# Patient Record
Sex: Male | Born: 1995 | Race: Black or African American | Hispanic: No | Marital: Single
Health system: Midwestern US, Academic
[De-identification: ages and names within clinical notes are randomized; demographics above are authoritative.]

---

## 2016-10-13 ENCOUNTER — Encounter: Admit: 2016-10-13 | Discharge: 2016-10-14 | Primary: Adolescent Medicine

## 2016-10-13 DIAGNOSIS — R69 Illness, unspecified: Principal | ICD-10-CM

## 2016-10-14 ENCOUNTER — Encounter: Admit: 2016-10-14 | Discharge: 2016-10-15 | Primary: Adolescent Medicine

## 2016-10-14 DIAGNOSIS — R69 Illness, unspecified: Principal | ICD-10-CM

## 2016-10-19 ENCOUNTER — Encounter: Admit: 2016-10-19 | Discharge: 2016-10-19 | Payer: MEDICAID | Primary: Adolescent Medicine

## 2016-10-19 DIAGNOSIS — R69 Illness, unspecified: Principal | ICD-10-CM

## 2016-10-21 ENCOUNTER — Encounter: Admit: 2016-10-21 | Discharge: 2016-10-21 | Payer: MEDICAID | Primary: Adolescent Medicine

## 2016-10-21 ENCOUNTER — Ambulatory Visit: Admit: 2016-10-21 | Discharge: 2016-10-22 | Payer: MEDICAID | Primary: Adolescent Medicine

## 2016-10-21 ENCOUNTER — Ambulatory Visit: Admit: 2016-10-21 | Discharge: 2016-10-21 | Primary: Adolescent Medicine

## 2016-10-21 DIAGNOSIS — J324 Chronic pansinusitis: ICD-10-CM

## 2016-10-21 DIAGNOSIS — J341 Cyst and mucocele of nose and nasal sinus: Principal | ICD-10-CM

## 2016-10-21 DIAGNOSIS — J45909 Unspecified asthma, uncomplicated: Principal | ICD-10-CM

## 2016-10-21 DIAGNOSIS — M952 Other acquired deformity of head: ICD-10-CM

## 2016-10-21 DIAGNOSIS — J339 Nasal polyp, unspecified: Principal | ICD-10-CM

## 2016-10-21 DIAGNOSIS — H05812 Cyst of left orbit: ICD-10-CM

## 2016-10-21 DIAGNOSIS — G960 Cerebrospinal fluid leak: Principal | ICD-10-CM

## 2016-10-21 NOTE — Progress Notes
Date of Service: 10/21/2016    Subjective:             Jonathon Simon is a 21 y.o. male.    History of Present Illness  Jonathon Simon presents to clinic for evaluation of sinus mass.     He was seen at [facility] and discharged on 10/18/16 and told to follow up with me at [facility].   He was evaluated at [facility] for a potential mucocele of the orbital region and erosion of the bilateral frontal sinus walls.  He was administered IV antibiotics for 72 hours, finishing on 7/1 and was put on PO Augmentin 875 BID for 21 days, as well as prescribed a prednisone taper for 28 days.     Had seen an ENT for a year due to lack of smell for 4 years and nasal polyps.   Denies history of sinus surgery.  Symptoms have persisted for 2 years and were variable in intensity.   He has had visual disturbances, dizziness, headaches which prompted his visit to the hospital.   His sinus symptoms are also significant for congestion, facial swelling, sinus pain and pressure, sore throat, epistaxis, postnasal drip, and rhinorrhea. The rhinorrhea is bilateral with the right side being very runny, water like.  When he breathes right now he feels pressure in his sinuses.   His sense of smell has returned.  He intermittently has some bilateral rhinorrhea.        Review of Systems   Constitutional: Positive for diaphoresis.   HENT: Positive for congestion, facial swelling, nosebleeds, postnasal drip, rhinorrhea, sinus pain, sinus pressure, sneezing and sore throat.    Eyes: Positive for photophobia, pain, discharge, itching and visual disturbance.   Respiratory: Positive for cough.    Cardiovascular: Negative.    Gastrointestinal: Positive for nausea and vomiting.   Endocrine: Negative.    Genitourinary: Negative.    Musculoskeletal: Positive for neck pain and neck stiffness.   Skin: Negative.    Allergic/Immunologic: Negative.    Neurological: Positive for dizziness, numbness and headaches.   Hematological: Negative. Psychiatric/Behavioral: The patient is nervous/anxious.          Objective:         ??? alprazolam (XANAX PO) Take  by mouth.   ??? AMOXICILLIN PO Take  by mouth.   ??? fluticasone (FLONASE) 50 mcg/actuation nasal spray Apply  to each nostril as directed daily. Shake bottle gently before using.   ??? morphine IR (MS-IR) 15 mg tablet Take 15 mg by mouth every 4 hours as needed for Pain   ??? PREDNISONE PO Take  by mouth.   ??? sinus rinse pack kit Apply 1 packet into nose as directed as Needed.     Vitals:    10/21/16 1352   BP: 127/76   Pulse: 71   Weight: 99.8 kg (220 lb)   Height: 182.9 cm (72)     Body mass index is 29.84 kg/m???.     Physical Exam  General:  Well-developed, well-nourished  Communication and Voice:  Clear pitch and clarity  Hearing: Hearing adequate for verbal communication bilaterally   Inspection:  Normocephalic and atraumatic without mass or lesion  Palpation:  Facial skeleton intact without bony stepoffs  Facial Strength:  Facial motility symmetric and full bilaterally  Pinna:  External ear intact and fully developed  External canal:  Canal is patent with intact skin  Tympanic Membrane:  Normal bilaterally  External nose:  No scar or anatomic deformity  Internal Nose:  Septum intact with mild S-shaped septal deviation with spur.  + edema, + polyp, or rhinorrhea.  Oral cavity, Lips, Teeth, and Gums:  Mucosa and teeth intact and viable, No lesions, masses or ulcers  Oropharynx: No erythema or exudate, no masses or ulcerations, non-obstructive tonsils  Larynx:  Normal voice, no stridor or stertor.    Neck, Trachea, Lymphatics:  Midline trachea without mass or lesion, no lymphadenopathy  Thyroid:  No mass or nodularity  Eyes: No nystagmus with equal extraocular motion bilaterally  Neuro/Psych/Balance: Patient oriented and appropriate in interaction;  Appropriate mood and affect;  Gait is intact with no imbalance; Cranial nerves I-XII are intact Respiratory effort:  Equal inspiration and expiration, no respiratory distress  Peripheral Vascular:  Warm extremities with equal distal pulses    Nasal Endoscopy: Patient is anesthetized with 4% lidocaine and decongested with neosynephrine.  Rigid endoscopy is performed; the patient tolerated the procedure well.  There is a mild septal deviation with a spur.  There is bilateral inferior turbinate hypertrophy.  There is global boggy mucosal edema.  The sinonasal anatomy is normal.  The bilateral osteomeatal complexes are partially obstructed with polyps as is the sphenoethmoid recess and the face of sphenoid is normal bilaterally.  There are no masses, no purulence.  + polyps confined to the upper nasal cavity.  The nasopharynx is normal.  Polyps in the bilateral middle meatus.     CT and MRI images from [facility] reviewed.  There is a massive amount of polyp burden and there is dehiscence of the posterior table bilaterally as well as dehiscence into the orbit on the left. No suspicious areas for tumor.         Assessment and Plan:  Encounter Diagnoses   Name Primary?   ??? CSF leak Yes   ??? Mucocele of frontal sinus    ??? Mucocele of left orbital region    ??? Chronic pansinusitis    ??? Nasal polyposis    ??? Acquired skull defect          Right now his symptoms and CT findings indicate a need for surgery to clear out the mucocele and polyps and decompress the frontal sinuses in order to prevent impending intracranial or orbital complications. This might allow the eroded portions of the frontal sinus wall to re-ossify.   The current resolution of his symptoms is likely due to the prednisone and inpatient steroid administration.  He has responded well to steroids but clearly still needs sinus surgery.    I do not think that the rhinorrhea is CSF, but I gave patient a sterile container to try to catch rhinorrhea to evaluate for CSF with beta 2 transferrin.  It is more likely that the rhinorrhea is due to the secretion of the inflamed mucosa.    Obtain navigation CT prior to surgery to use intraoperatively.  Another indication for the CT is that the polyp disease did change significantly with steroids, I think it important to know what is happening within the sinuses prior to surgery given the large amount of skull base erosion.                       In the presence of Lamar Benes, MD,  I have taken down these notes, Cormac Prosser, Scribe. 10/21/2016 2:42 PM

## 2016-10-22 ENCOUNTER — Encounter: Admit: 2016-10-22 | Discharge: 2016-10-22 | Payer: MEDICAID | Primary: Adolescent Medicine

## 2016-10-22 DIAGNOSIS — R69 Illness, unspecified: Principal | ICD-10-CM

## 2016-10-28 ENCOUNTER — Encounter: Admit: 2016-10-28 | Discharge: 2016-10-28 | Payer: MEDICAID | Primary: Adolescent Medicine

## 2016-10-28 DIAGNOSIS — J45909 Unspecified asthma, uncomplicated: Principal | ICD-10-CM

## 2016-10-29 ENCOUNTER — Ambulatory Visit: Admit: 2016-10-29 | Discharge: 2016-10-29 | Primary: Adolescent Medicine

## 2016-10-29 ENCOUNTER — Encounter: Admit: 2016-10-29 | Discharge: 2016-10-29 | Payer: MEDICAID | Primary: Adolescent Medicine

## 2016-10-29 ENCOUNTER — Encounter: Admit: 2016-10-28 | Discharge: 2016-10-28 | Payer: MEDICAID | Primary: Adolescent Medicine

## 2016-10-29 DIAGNOSIS — J339 Nasal polyp, unspecified: ICD-10-CM

## 2016-10-29 DIAGNOSIS — R040 Epistaxis: ICD-10-CM

## 2016-10-29 DIAGNOSIS — J45909 Unspecified asthma, uncomplicated: ICD-10-CM

## 2016-10-29 DIAGNOSIS — F419 Anxiety disorder, unspecified: ICD-10-CM

## 2016-10-29 DIAGNOSIS — K219 Gastro-esophageal reflux disease without esophagitis: ICD-10-CM

## 2016-10-29 DIAGNOSIS — J329 Chronic sinusitis, unspecified: ICD-10-CM

## 2016-10-29 DIAGNOSIS — J310 Chronic rhinitis: ICD-10-CM

## 2016-10-29 DIAGNOSIS — Z789 Other specified health status: ICD-10-CM

## 2016-10-29 MED ORDER — OXYCODONE 5 MG PO TAB
ORAL_TABLET | ORAL | 0 refills | 6.00000 days | Status: AC | PRN
Start: 2016-10-29 — End: 2016-11-02
  Filled 2016-10-29 (×2): qty 15, 3d supply, fill #1

## 2016-10-29 MED ORDER — SUCCINYLCHOLINE CHLORIDE 20 MG/ML IJ SOLN
INTRAVENOUS | 0 refills | Status: DC
Start: 2016-10-29 — End: 2016-10-29
  Administered 2016-10-29: 15:00:00 40 mg via INTRAVENOUS

## 2016-10-29 MED ORDER — ALPRAZOLAM 0.5 MG PO TAB
0.25 mg | Freq: Once | ORAL | 0 refills | Status: CP
Start: 2016-10-29 — End: ?
  Administered 2016-10-29: 13:00:00 0.25 mg via ORAL

## 2016-10-29 MED ORDER — WHITE PETROLATUM-MINERAL OIL 83-15 % OP OINT
0 refills | Status: DC
Start: 2016-10-29 — End: 2016-11-01
  Administered 2016-10-29: 15:00:00 1 via OPHTHALMIC

## 2016-10-29 MED ORDER — FENTANYL CITRATE (PF) 50 MCG/ML IJ SOLN
0 refills | Status: DC
Start: 2016-10-29 — End: 2016-10-29
  Administered 2016-10-29: 15:00:00 50 ug via INTRAVENOUS

## 2016-10-29 MED ORDER — MORPHINE 15 MG PO TBER
15 mg | Freq: Once | ORAL | 0 refills | Status: CP
Start: 2016-10-29 — End: ?
  Administered 2016-10-29: 13:00:00 15 mg via ORAL

## 2016-10-29 MED ORDER — FENTANYL CITRATE (PF) 50 MCG/ML IJ SOLN
50 ug | INTRAVENOUS | 0 refills | Status: DC | PRN
Start: 2016-10-29 — End: 2016-11-01
  Administered 2016-10-29 (×4): 50 ug via INTRAVENOUS

## 2016-10-29 MED ORDER — MEPERIDINE (PF) 25 MG/ML IJ SYRG
12.5 mg | INTRAVENOUS | 0 refills | Status: DC | PRN
Start: 2016-10-29 — End: 2016-11-01

## 2016-10-29 MED ORDER — PREDNISONE 10 MG PO TAB
10 mg | ORAL_TABLET | Freq: Every day | ORAL | 0 refills | Status: AC
Start: 2016-10-29 — End: 2016-12-16
  Filled 2016-10-29 (×2): qty 40, 16d supply, fill #1

## 2016-10-29 MED ORDER — LACTATED RINGERS IV SOLP
1000 mL | INTRAVENOUS | 0 refills | Status: AC
Start: 2016-10-29 — End: ?
  Administered 2016-10-29: 20:00:00 1000 mL via INTRAVENOUS
  Administered 2016-10-29 (×2): 1000.000 mL via INTRAVENOUS

## 2016-10-29 MED ORDER — REMIFENTANYL 1000MCG IN NS 20ML (OR)
0 refills | Status: DC
Start: 2016-10-29 — End: 2016-10-29
  Administered 2016-10-29 (×2): .05 ug/kg/min via INTRAVENOUS

## 2016-10-29 MED ORDER — MIDAZOLAM 1 MG/ML IJ SOLN
INTRAVENOUS | 0 refills | Status: DC
Start: 2016-10-29 — End: 2016-10-29
  Administered 2016-10-29: 15:00:00 2 mg via INTRAVENOUS

## 2016-10-29 MED ORDER — ACETAMINOPHEN 1,000 MG/100 ML (10 MG/ML) IV SOLN
1000 mg | Freq: Once | INTRAVENOUS | 0 refills | Status: CP
Start: 2016-10-29 — End: ?
  Administered 2016-10-29: 20:00:00 1000 mg via INTRAVENOUS

## 2016-10-29 MED ORDER — HYDROCORTISONE SOD SUCC (PF) 100 MG/2 ML IJ SOLR
0 refills | Status: DC
Start: 2016-10-29 — End: 2016-10-29
  Administered 2016-10-29: 15:00:00 100 mg via INTRAVENOUS

## 2016-10-29 MED ORDER — LIDOCAINE (PF) 200 MG/10 ML (2 %) IJ SYRG
0 refills | Status: DC
Start: 2016-10-29 — End: 2016-10-29
  Administered 2016-10-29: 15:00:00 100 mg via INTRAVENOUS

## 2016-10-29 MED ORDER — LIDOCAINE (PF) 10 MG/ML (1 %) IJ SOLN
.1-2 mL | INTRAMUSCULAR | 0 refills | Status: DC | PRN
Start: 2016-10-29 — End: 2016-11-01
  Administered 2016-10-29: 12:00:00 0.1 mL via INTRAMUSCULAR

## 2016-10-29 MED ORDER — ACETAMINOPHEN 500 MG PO TAB
500 mg | Freq: Once | ORAL | 0 refills | Status: DC
Start: 2016-10-29 — End: 2016-10-29

## 2016-10-29 MED ORDER — PROPOFOL INJ 10 MG/ML IV VIAL
0 refills | Status: DC
Start: 2016-10-29 — End: 2016-10-29
  Administered 2016-10-29: 15:00:00 200 mg via INTRAVENOUS

## 2016-10-29 MED ORDER — ACETAMINOPHEN 500 MG PO TAB
500 mg | ORAL_TABLET | ORAL | 0 refills | Status: AC | PRN
Start: 2016-10-29 — End: 2016-11-04

## 2016-10-29 MED ORDER — PHENYLEPHRINE IN 0.9% NACL(PF) 1 MG/10 ML (100 MCG/ML) IV SYRG
INTRAVENOUS | 0 refills | Status: DC
Start: 2016-10-29 — End: 2016-10-29
  Administered 2016-10-29: 17:00:00 100 ug via INTRAVENOUS

## 2016-10-29 MED ORDER — OXYMETAZOLINE 0.05 % NA SPRY
0 refills | Status: DC
Start: 2016-10-29 — End: 2016-11-01
  Administered 2016-10-29: 15:00:00 3 via NASAL

## 2016-10-29 MED ORDER — LIDOCAINE 1%-EPINEPHRINE 1:100000 (BUFFERED) VIAL
0 refills | Status: DC
Start: 2016-10-29 — End: 2016-11-01
  Administered 2016-10-29 (×2): 8 mL via INTRAMUSCULAR

## 2016-10-29 MED ORDER — CEFAZOLIN 1 GRAM IJ SOLR
0 refills | Status: DC
Start: 2016-10-29 — End: 2016-10-29
  Administered 2016-10-29: 15:00:00 2 g via INTRAVENOUS

## 2016-10-29 MED ORDER — SUGAMMADEX 100 MG/ML IV SOLN
INTRAVENOUS | 0 refills | Status: DC
Start: 2016-10-29 — End: 2016-10-29
  Administered 2016-10-29: 19:00:00 204 mg via INTRAVENOUS

## 2016-10-29 MED ORDER — DEXAMETHASONE SODIUM PHOSPHATE 4 MG/ML IJ SOLN
4 mg | Freq: Once | INTRAVENOUS | 0 refills | Status: AC | PRN
Start: 2016-10-29 — End: ?

## 2016-10-29 MED ADMIN — LACTATED RINGERS IV SOLP [4318]: 1000 mL | INTRAVENOUS | @ 12:00:00 | Stop: 2016-10-29 | NDC 00338011704

## 2016-10-29 NOTE — H&P (View-Only)
Admission History and Physical Examination      Name:  Jonathon Simon                                             MRN:  [id number]   Admission Date:  10/29/2016                     Assessment/Plan:    Principal Problem:    Nasal polyps      I have seen and examined Jonathon Simon. There are no significant changes from the previous H&P performed on 10/21/16. Plan is for OR today for bilateral endoscopic sinus surgery (maxillary, ethmoid, sphenoid, frontal), possble csf leak repair or septoplasty as indicated with Dr. Sol Blazing. The risks, benefits, and alternatives to the procedure were discussed with the patient, who demonstrated understanding of the above and wishes to proceed. All questions were answered. Written informed consent was obtained and placed in the chart.    Mia Creek, MD  Otolaryngology Resident   Pager [phone]      __________________________________________________________________________________  Primary Care Physician: No Pcp, Na     Chief Complaint: chronic rhinosinusitis w nasal polypsis.     History of Present Illness: Jonathon Simon is a 21 y.o. male who presents to [facility] for bilateral endoscopic sinus surgery (maxillary, ethmoid, sphenoid, frontal) with Dr. Sol Blazing. Last seen in ENT clinic 10/21/16. No interval hospitalizations or changes to medication. Has been NPO since midnight.     HPI from most recent clinic visit is included below:  Jonathon Simon presents to clinic for evaluation of sinus mass.   ???  He was seen at [facility] and discharged on 10/18/16 and told to follow up with me at [facility].   He was evaluated at [facility] for a potential mucocele of the orbital region and erosion of the bilateral frontal sinus walls.  He was administered IV antibiotics for 72 hours, finishing on 7/1 and was put on PO Augmentin 875 BID for 21 days, as well as prescribed a prednisone taper for 28 days.   ??? Had seen an ENT for a year due to lack of smell for 4 years and nasal polyps.   Denies history of sinus surgery.  Symptoms have persisted for 2 years and were variable in intensity.   He has had visual disturbances, dizziness, headaches which prompted his visit to the hospital.   His sinus symptoms are also significant for congestion, facial swelling, sinus pain and pressure, sore throat, epistaxis, postnasal drip, and rhinorrhea. The rhinorrhea is bilateral with the right side being very runny, water like.  When he breathes right now he feels pressure in his sinuses.   His sense of smell has returned.  He intermittently has some bilateral rhinorrhea.     Past Medical History:   Diagnosis Date   ??? Anxiety    ??? Asthma    ??? No blood products     No is Jehovah's Witness, does not want any blood products.    ??? Rhinitis    ??? Sinusitis      Past Surgical History:   Procedure Laterality Date   ??? BIOPSY      biopsy on pt's penis     Family history reviewed; non-contributory  Social History     Social History   ??? Marital status: Single  Spouse name: N/A   ??? Number of children: N/A   ??? Years of education: N/A     Social History Main Topics   ??? Smoking status: Never Smoker   ??? Smokeless tobacco: Never Used   ??? Alcohol use No      Comment: pt states I used to on occasion   ??? Drug use: No   ??? Sexual activity: Not on file     Other Topics Concern   ??? Not on file     Social History Narrative   ??? No narrative on file      Immunizations (includes history and patient reported):   There is no immunization history on file for this patient.        Allergies:  Iodine and Peanut    Medications:  Prescriptions Prior to Admission   Medication Sig   ??? alprazolam (XANAX PO) Take  by mouth.   ??? AMOXICILLIN PO Take  by mouth.   ??? fluticasone (FLONASE) 50 mcg/actuation nasal spray Apply  to each nostril as directed daily. Shake bottle gently before using.   ??? morphine IR (MS-IR) 15 mg tablet Take 15 mg by mouth every 4 hours as needed for Pain   ??? polyethylene glycol 3350 (MIRALAX) 17 g packet Take 17 g by mouth daily.   ??? PREDNISONE PO Take  by mouth.   ??? sinus rinse pack kit Apply 1 packet into nose as directed as Needed.     Review of Systems:  All other systems reviewed and are negative.    Physical Exam:  Vital Signs: Last Filed In 24 Hours Vital Signs: 24 Hour Range         Intensity Pain Scale 0-10 (Pain 1): 7 (when pt moves his eye) (10/29/16 0706)      General:  Well-developed, well-nourished  Communication and Voice:  Clear pitch and clarity  Hearing: Hearing adequate for verbal communication bilaterally   Inspection:  Normocephalic and atraumatic without mass or lesion  Palpation:  Facial skeleton intact without bony stepoffs  Facial Strength:  Facial motility symmetric and full bilaterally  Pinna:  External ear intact and fully developed  External canal:  Canal is patent with intact skin  Tympanic Membrane:  Normal bilaterally  External nose:  No scar or anatomic deformity  Internal Nose:  Septum intact with mild S-shaped septal deviation with spur.  No edema, polyp, or rhinorrhea.  Oral cavity, Lips, Teeth, and Gums:  Mucosa and teeth intact and viable, No lesions, masses or ulcers  Oropharynx: No erythema or exudate, no masses or ulcerations, non-obstructive tonsils  Larynx:  Normal voice, no stridor or stertor.    Neck, Trachea, Lymphatics:  Midline trachea without mass or lesion, no lymphadenopathy  Thyroid:  No mass or nodularity  Eyes: No nystagmus with equal extraocular motion bilaterally  Neuro/Psych/Balance: Patient oriented and appropriate in interaction;  Appropriate mood and affect;  Gait is intact with no imbalance; Cranial nerves I-XII are intact  Respiratory effort:  Equal inspiration and expiration, no respiratory distress  Peripheral Vascular:  Warm extremities with equal distal pulses    Lab/Radiology/Other Diagnostic Tests:  24-hour labs:  No results found for this visit on 10/29/16 (from the past 24 hour(s)).     Pertinent radiology reviewed.    Mia Creek, MD  Pager

## 2016-10-29 NOTE — Anesthesia Post-Procedure Evaluation
Post-Anesthesia Evaluation    Name: Jonathon Simon      MRN: [id number]     DOB: 02-Jun-1995     Age: 21 y.o.     Sex: male   __________________________________________________________________________     Procedure Date: 10/29/2016  Procedure: Procedure(s) with comments:  ENDOSCOPY NASAL/ SINUS WITH ETHMOIDECTOMY AND SPHENOIDOTOMY/ TISSUE REMOVAL - CASE LENGTH 3 HOURS, STRYKER NAVIGATION AND CT  ENDOSCOPY NASAL/ SINUS WITH EXCISION TISSUE MAXILLARY SINUS  ENDOSCOPY NASAL/ SINUS WITH FRONTAL SINUS EXPLORATION WITH/ WITHOUT TISSUE REMOVAL  FUNCTIONAL ENDOSCOPY SINUS SURGERY IMAGE-GUIDED      Surgeon: Surgeon(s):  Lovie CholLepse, Jason, MD  Lovie CholLepse, Jason, MD  Lamar BenesBeahm, Donald David, MD  Lamar BenesBeahm, Donald David, MD  Mia CreekKavookjian, Hannah, MD  Mia CreekKavookjian, Hannah, MD    Post-Anesthesia Vitals  BP: 116/66 (07/13 1600)  Temp: 36.5 C (97.7 F) (07/13 1600)  Pulse: 89 (07/13 1600)  Respirations: 17 PER MINUTE (07/13 1600)  SpO2: 99 % (07/13 1600)  O2 Delivery: None (Room Air) (07/13 1600)  SpO2 Pulse: 86 (07/13 1600)      Post Anesthesia Evaluation Note    Evaluation location: Pre/Post  Patient participation: recovered; patient participated in evaluation  Level of consciousness: alert    Pain score: 5  Pain management: adequate    Hydration: normovolemia  Temperature: 36.0C - 38.4C  Airway patency: adequate    Perioperative Events  Perioperative events:  no       Post-op nausea and vomiting: no PONV    Postoperative Status  Cardiovascular status: hemodynamically stable  Respiratory status: spontaneous ventilation  Follow-up needed: none      Perioperative Events  Perioperative Event: No  Emergency Case Activation: No     Post-Anesthesia Evaluation Attestation: I reviewed and agree the indicated post-anethesia care was provided.    Staff name:  Brayton LaymanSheldon Jarin Cornfield, MD Date:  10/29/2016

## 2016-11-01 NOTE — Operative Report(Direct Entry)
OPERATIVE REPORT    Name: Jonathon Simon is a 21 y.o. male     DOB: 09-Mar-1996             MRN#: [id number]    DATE OF OPERATION: 10/29/2016    Surgeon(s) and Role:     * Lovie Chol, MD - Resident - Observing     * Lovie Chol, MD - Resident - Observing     * Tasman Zapata, Evans Lance, MD - Primary     * Kariann Wecker, Evans Lance, MD - Primary     * Mia Creek, MD - Resident - Assisting     * Mia Creek, MD - Resident - Assisting        Preoperative Diagnosis:    Nasal polyps [J33.9]    Post-op Diagnosis      * Nasal polyps [J33.9]    Procedure(s) (LRB):  NASAL ENDOSCOPY WITH TOTAL ETHMOIDECTOMY AND SPHENOIDOTOMY WITH TISSUE REMOVAL (Bilateral)  NASAL ENDOSCOPY WITH MAXILLARY ANTROSTOMY WITH TISSUE MAXILLARY REMOVAL (Bilateral)  NASAL ENDOSCOPY WITH FRONTAL SINUSOTOMY (Bilateral)  FUNCTIONAL ENDOSCOPY SINUS SURGERY IMAGE-GUIDED (Bilateral)  SUBMUCOUS RESECTION TURBINATE (Bilateral)    Anesthesia Type: General    Description and Findings of Operative Procedure:     The patient was identified in the pre-operative holding area. The consent for surgery was reaffirmed and all questions were answered. The patient agreed to proceed with surgery.    The sinus surgery itself was discussed at length. The risks, benefits, and alternatives were explained in detail which include but is not limited to: anesthesia, pain, bleeding, infection, need for nasal packing, loss of smell/taste, double vision, vision loss, CSF leak requiring other procedures to repair, numbness of teeth/and or face, need for revision surgery, brain/skull base injury and unexpected risks. The patient understands these risks and is willing to proceed with surgery.  All questions were answered.    The patient was taken to the operating theater and was placed in the supine position on the operating table. A pre-induction timeout was performed confirming the patient's name, medical record number, date of birth, the planned operative procedure, and patient's medication allergies. The patient then underwent induction of general anesthesia and intubation by the Anesthesia staff. The table was then rotated 90 degrees.    The stereotactic CT image guidance system was then brought to the field. The CT data were loaded, reviewed, and an operative plan was finalized. The patient underwent surface match registration that was confirmed accurate in 3 separate planes. The patient was prepped and draped in the standard fashion for endoscopic sinus surgery. The bilateral nasal cavities were prepared with Afrin pledgets.     Next, the 0-degree endoscope was used to visualize the each nasal cavity. 1% Lidocaine with 1:100,000 Epinephrine was then injected into the anticipated operative sites.     The procedure began with left maxillary endoscopy with mucous membrane removal. The microdebrider was used to remove the polyps and polypoid tissue from the middle meatus. An inferior slot in the uncinate process was made with the tip of the pediatric backbiter and then the vertical portion removed with sharp dissection and microdebrider.  A 30 degree endoscope was then used to identify the maxillary osteum and the maxillary sinus natural ostium was then entered with a straight through cutting forceps. There was noted to be polypoid changes within the maxillary sinus.  The ostium was widened to the posterior fontanelle.  Polyps within the maxillary sinus were removed with an angled microdebrider. An angled endoscope was  used to confirm the connection to the natural maxillary sinus os, which was widened circumferentially and mucous membranes were removed. The sinus then was copiously irrigated.    Then left nasal endoscopy with total ethmoidectomy was performed. The ethmoid bulla was removed with a microdebrider and kerrison. Anterior ethmoid cells were removed with straight and up biting through cutting forceps and microdebrider. The basal lamella was then entered.  All anterior and posterior ethmoid cells were completely removed with straight and up biting through cutting forceps and the Kerrison rongeurs. The skull base and lamina papyracea was identified with direct visualization and with image guidance and protected at all times. The anterior and posterior ethmoid cells were meticulously taken off the skull base as well as the lamina papyracea with use of angled instrumentation in a posterior to anterior fashion. Polyps within the ethmoid sinuses were resected with the microdebrider.  The sinus was copiously irrigated.    Next, left sphenoid endoscopy with mucous membrane was performed. The middle turbinate was medialized and the sphenoid os was identified and then entered. The anterior face of the sphenoid was then enlarged with Kerrison rongeurs. Polypoid changes and mucosa were noted and removed from the sphenoid os with the debrider.  The skull base was identified within the sphenoid sinus. The sinus then was copiously irrigated.    Next, the 0-degree endoscope was used to visualize the right nasal cavity.     Then, right maxillary endoscopy with mucous membrane removal was performed.  The microdebrider was used to remove the polyps and polypoid tissue from the middle meatus. An inferior slot in the uncinate process was made with the tip of the pediatric backbiter and then the vertical portion removed with sharp dissection and microdebrider.  A 30 degree endoscope was then used to identify the maxillary osteum and the maxillary sinus natural ostium was then entered with a straight through cutting forceps. There was noted to be polypoid changes within the maxillary sinus.  The ostium was widened to the posterior fontanelle.  Polyps within the maxillary sinus were removed with an angled microdebrider. An angled endoscope was used to confirm the connection to the natural maxillary sinus os, which was widened circumferentially and mucous membranes were removed. The sinus then was copiously irrigated.    Then right nasal endoscopy with total ethmoidectomy was performed. The ethmoid bulla was removed with a microdebrider and kerrison. Anterior ethmoid cells were removed with straight and up biting through cutting forceps and microdebrider. The basal lamella was then entered.  All anterior and posterior ethmoid cells were completely removed with straight and up biting through cutting forceps and the Kerrison rongeurs. The skull base and lamina papyracea was identified with direct visualization and with image guidance and protected at all times. The anterior and posterior ethmoid cells were meticulously taken off the skull base as well as the lamina papyracea with use of angled instrumentation in a posterior to anterior fashion.  Polyps within the ethmoid sinuses were resected with the microdebrider.  The sinus was copiously irrigated.    Next, right sphenoid endoscopy with mucous membrane was performed. The middle turbinate was medialized and the sphenoid os was identified and then entered. The anterior face of the sphenoid was then enlarged with Kerrison rongeurs. Polypoid changes and mucosa were noted and removed from the sphenoid os with the debrider.  The skull base was identified within the sphenoid sinus. The sinus then was copiously irrigated.    Next, left nasal endoscopy with frontal sinusotomy was  performed. The skull base was then followed anteriorly.  A combination of 30, 45, and 70 endoscopes were used to complete the surgery.  The frontal sinus was then entered. Angled instrumentation and an angled microdebrider was then used to open the natural frontal sinus outflow tract circumferentially.  Polyps within the frontal sinus were removed with the angled microdebrider.    Next, right nasal endoscopy with frontal sinusotomy was performed. The skull base was then followed anteriorly.  A combination of 30, 45, and 70 endoscopes were used to complete the surgery.  The frontal sinus was then entered. Angled instrumentation and an angled microdebrider was then used to open the natural frontal sinus outflow tract circumferentially.  Polyps within the frontal sinus were removed with the angled microdebrider.    Attention was then turned to the inferior nasal turbinates.  The anterior head of the turbinates were injected with 1% lidocaine with 1:100,000 epinephrine.  The anterior head of the inferior turbinate on the left was incised with a 15 blade knife and the submucosal tissues were freed from the surrounding bone with a Cottle elevator.  The debrider was then used to remove excess submucosal tissue.  The turbinate was then out-fractured.  The same procedure was then repeated on the right inferior turbinate.      Next, the bilateral nasal cavities were irrigated and hemostasis was achieved. Posisep was placed bilaterally in the middle meatus to facilitate healing and propel contour stents were placed in the frontal tracts. The patient was then suctioned with an orogastric tube. The patient was then rotated back toward anesthesia for reversal of general anesthetic and extubation. The operating table was then turned 90 degrees back to the care of the anesthesia team who reversed a general anesthetic and extubated patient without difficulty. The patient was then transferred to recovery in stable condition.    The patient tolerated the procedure well and there were no immediate postoperative complications.       Estimated Blood Loss:  200 ml    Specimen(s) Removed/Disposition:   ID Type Source Tests Collected by Time Destination   1 : BILATERAL SINUS CONTENTS - ROUTINE Tissue Sinus SURGICAL PATHOLOGY          Lamar Benes, MD 10/29/2016 1305            Lamar Benes, MD

## 2016-11-02 ENCOUNTER — Encounter: Admit: 2016-11-02 | Discharge: 2016-11-02 | Payer: MEDICAID | Primary: Adolescent Medicine

## 2016-11-02 DIAGNOSIS — J329 Chronic sinusitis, unspecified: ICD-10-CM

## 2016-11-02 DIAGNOSIS — K219 Gastro-esophageal reflux disease without esophagitis: ICD-10-CM

## 2016-11-02 DIAGNOSIS — J339 Nasal polyp, unspecified: ICD-10-CM

## 2016-11-02 DIAGNOSIS — J45909 Unspecified asthma, uncomplicated: Principal | ICD-10-CM

## 2016-11-02 DIAGNOSIS — J310 Chronic rhinitis: ICD-10-CM

## 2016-11-02 DIAGNOSIS — F419 Anxiety disorder, unspecified: ICD-10-CM

## 2016-11-02 DIAGNOSIS — Z789 Other specified health status: ICD-10-CM

## 2016-11-02 MED ORDER — OXYCODONE 5 MG PO TAB
ORAL_TABLET | ORAL | 0 refills | 6.00000 days | Status: AC | PRN
Start: 2016-11-02 — End: 2016-11-02

## 2016-11-02 MED ORDER — OXYCODONE 5 MG PO TAB
ORAL_TABLET | ORAL | 0 refills | 6.00000 days | Status: AC | PRN
Start: 2016-11-02 — End: 2016-11-04
  Filled 2016-11-02 (×2): qty 15, 2d supply, fill #1

## 2016-11-04 ENCOUNTER — Encounter: Admit: 2016-11-04 | Discharge: 2016-11-04 | Payer: MEDICAID | Primary: Adolescent Medicine

## 2016-11-04 ENCOUNTER — Ambulatory Visit: Admit: 2016-11-04 | Discharge: 2016-11-04 | Payer: MEDICAID | Primary: Adolescent Medicine

## 2016-11-04 DIAGNOSIS — Z789 Other specified health status: ICD-10-CM

## 2016-11-04 DIAGNOSIS — J339 Nasal polyp, unspecified: ICD-10-CM

## 2016-11-04 DIAGNOSIS — F419 Anxiety disorder, unspecified: ICD-10-CM

## 2016-11-04 DIAGNOSIS — J324 Chronic pansinusitis: Principal | ICD-10-CM

## 2016-11-04 DIAGNOSIS — J329 Chronic sinusitis, unspecified: ICD-10-CM

## 2016-11-04 DIAGNOSIS — K219 Gastro-esophageal reflux disease without esophagitis: ICD-10-CM

## 2016-11-04 DIAGNOSIS — J310 Chronic rhinitis: ICD-10-CM

## 2016-11-04 DIAGNOSIS — J45909 Unspecified asthma, uncomplicated: Principal | ICD-10-CM

## 2016-11-04 MED ORDER — MONTELUKAST 10 MG PO TAB
10 mg | ORAL_TABLET | Freq: Every evening | ORAL | 3 refills | 90.00000 days | Status: AC
Start: 2016-11-04 — End: 2019-01-29
  Filled 2016-11-04 (×2): qty 90, 90d supply, fill #1

## 2016-11-04 MED ORDER — OXYCODONE 5 MG PO TAB
ORAL_TABLET | Freq: Three times a day (TID) | ORAL | 0 refills | 6.00000 days | Status: AC
Start: 2016-11-04 — End: 2016-11-18
  Filled 2016-11-04 (×2): qty 20, 9d supply, fill #1

## 2016-11-04 MED ORDER — CETIRIZINE 10 MG PO TAB
10 mg | ORAL_TABLET | Freq: Every morning | ORAL | 3 refills | Status: AC
Start: 2016-11-04 — End: 2019-01-29

## 2016-11-04 NOTE — Progress Notes
Date of Service: 11/04/2016    Subjective:             Jonathon Simon is a 21 y.o. male.    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    His drainage is beginning to turn yellow.   He has stopped using his antibiotics.  He continues to use the nettipot.   He has avoided sneezing with force and blowing his nose.        Review of Systems   Constitutional: Negative.    HENT: Positive for congestion, postnasal drip and sinus pressure.    Eyes: Negative.    Respiratory: Negative.    Cardiovascular: Negative.    Gastrointestinal: Negative.    Endocrine: Negative.    Genitourinary: Negative.    Musculoskeletal: Negative.    Skin: Negative.    Allergic/Immunologic: Negative.    Neurological: Negative.    Hematological: Negative.    Psychiatric/Behavioral: Negative.          Objective:         ??? alprazolam (XANAX PO) Take 0.25 mg by mouth.   ??? AMOXICILLIN PO Take  by mouth.   ??? fluticasone (FLONASE) 50 mcg/actuation nasal spray Apply  to each nostril as directed daily. Shake bottle gently before using.   ??? morphine IR (MS-IR) 15 mg tablet Take 15 mg by mouth every 4 hours as needed for Pain   ??? oxyCODONE (ROXICODONE, OXY-IR) 5 mg tablet Take 1-2 tablsts every 4 hours as needed for severe pain.  Do not take with Xanax due to risk of respiratory suppression.   ??? polyethylene glycol 3350 (MIRALAX) 17 g packet Take 17 g by mouth daily.   ??? prednisone (DELTASONE) 10 mg tablet Take 4 tablets (40 mg) daily for 4 days THEN  Take 3 tablets (30 mg) daily for 4 days THEN  Take 2 tablets (20 mg) daily for 4 days THEN  Take 1 tablet (10 mg) daily for 4 days   ??? sinus rinse pack kit Apply 1 packet into nose as directed as Needed.     Vitals:    11/04/16 1125   BP: 144/85   Pulse: 92   Weight: 99.8 kg (220 lb)   Height: 182.9 cm (72)     Body mass index is 29.84 kg/m???.     Physical Exam  General:  Well-developed, well-nourished Inspection:  Normocephalic and atraumatic without mass or lesion  Palpation:  Facial skeleton intact without bony stepoffs  Facial Strength:  Facial motility symmetric and full bilaterally  Tympanic Membrane:  Normal bilaterally  External nose:  No scar or anatomic deformity  Internal Nose:  Septum intact with mild S-shaped septal deviation with spur.  No edema, polyp, or rhinorrhea.  Oral cavity, Lips, Teeth, and Gums:  Mucosa and teeth intact and viable, No lesions, masses or ulcers  Oropharynx: No erythema or exudate, no masses or ulcerations, non-obstructive tonsils  Larynx:  Normal voice, no stridor or stertor.    Neck, Trachea, Lymphatics:  Midline trachea without mass or lesion, no lymphadenopathy  Eyes: No nystagmus with equal extraocular motion bilaterally  Cranial nerves I-XII are intact    Nasal Endoscopy: Patient is anesthetized with 4% lidocaine and decongested with neosynephrine.  Rigid endoscopy is performed; the patient tolerated the procedure well. Debridement is performed with 0 and 30 degree endoscopes.  Straight and 70 and 90 degree suctions are used as well as  the malleable suctions for the frontal sinuses and maxillary sinuses.  Straight and up biting Blakesley forceps are used as well as Hartmann forceps for removal of the solid debris and packing.  The bilateral sinus cavities are debrided of old blood, mucous and dissolvable packing.  There is no purulence.  The nose and sinuses are healing well.  The frontal sinuses are open and healing.    Path reviewed with him:  ???Respiratory mucosa and bone, bilateral sinus contents, excision:   Chronic sinusitis without eosinophilia with focal acute inflammation.   Structure consistent with a fragmented polyp        Assessment and Plan:  Encounter Diagnoses   Name Primary?   ??? Chronic pansinusitis Yes   ??? Nasal polyps        He had many questions about his recovery and long term care. I answered all his questions in full. We discussed his polyps and future management. He should keep alert for a decrease in his sense of smell.     He is ok to gently blow his nose. He should still hold off on contact sports or anything very strenuous for now.     He should continue with his nasal saline irrigations BID or more as he wants.   He can begin use of Zyrtec or Allegra.   We will send in a prescription for Singulair.   Start nasal steroid sprays--flonase    RTC 2 weeks.                           In the presence of Lamar Benes, MD,  I have taken down these notes, Cormac Prosser, Scribe. 11/04/2016 12:13 PM

## 2016-11-11 ENCOUNTER — Encounter: Admit: 2016-11-11 | Discharge: 2016-11-11 | Payer: MEDICAID | Primary: Adolescent Medicine

## 2016-11-11 DIAGNOSIS — Z789 Other specified health status: ICD-10-CM

## 2016-11-11 DIAGNOSIS — J310 Chronic rhinitis: ICD-10-CM

## 2016-11-11 DIAGNOSIS — J329 Chronic sinusitis, unspecified: ICD-10-CM

## 2016-11-11 DIAGNOSIS — K219 Gastro-esophageal reflux disease without esophagitis: ICD-10-CM

## 2016-11-11 DIAGNOSIS — J45909 Unspecified asthma, uncomplicated: Principal | ICD-10-CM

## 2016-11-11 DIAGNOSIS — J339 Nasal polyp, unspecified: ICD-10-CM

## 2016-11-11 DIAGNOSIS — F419 Anxiety disorder, unspecified: ICD-10-CM

## 2016-11-13 ENCOUNTER — Encounter: Admit: 2016-11-13 | Discharge: 2016-11-13 | Payer: MEDICAID | Primary: Adolescent Medicine

## 2016-11-13 DIAGNOSIS — J310 Chronic rhinitis: ICD-10-CM

## 2016-11-13 DIAGNOSIS — J329 Chronic sinusitis, unspecified: ICD-10-CM

## 2016-11-13 DIAGNOSIS — K219 Gastro-esophageal reflux disease without esophagitis: ICD-10-CM

## 2016-11-13 DIAGNOSIS — J45909 Unspecified asthma, uncomplicated: Principal | ICD-10-CM

## 2016-11-13 DIAGNOSIS — J339 Nasal polyp, unspecified: ICD-10-CM

## 2016-11-13 DIAGNOSIS — F419 Anxiety disorder, unspecified: ICD-10-CM

## 2016-11-13 DIAGNOSIS — Z789 Other specified health status: ICD-10-CM

## 2016-11-17 ENCOUNTER — Encounter: Admit: 2016-11-17 | Discharge: 2016-11-17 | Payer: MEDICAID | Primary: Adolescent Medicine

## 2016-11-17 NOTE — Telephone Encounter
Patient stated he was having blurry vision, but denied loss of complete vision. Dr Sol BlazingBeahm notified and there is no concern as of right now. Appt that was scheduled for today has been rescheduled for tomorrow. LVM notifying patient that appt for today would be cancelled

## 2016-11-18 ENCOUNTER — Encounter: Admit: 2016-11-18 | Discharge: 2016-11-18 | Payer: MEDICAID | Primary: Adolescent Medicine

## 2016-11-18 ENCOUNTER — Ambulatory Visit: Admit: 2016-11-18 | Discharge: 2016-11-19 | Payer: MEDICAID | Primary: Adolescent Medicine

## 2016-11-18 DIAGNOSIS — J339 Nasal polyp, unspecified: ICD-10-CM

## 2016-11-18 DIAGNOSIS — J45909 Unspecified asthma, uncomplicated: Principal | ICD-10-CM

## 2016-11-18 DIAGNOSIS — J341 Cyst and mucocele of nose and nasal sinus: ICD-10-CM

## 2016-11-18 DIAGNOSIS — J310 Chronic rhinitis: ICD-10-CM

## 2016-11-18 DIAGNOSIS — J324 Chronic pansinusitis: ICD-10-CM

## 2016-11-18 DIAGNOSIS — F419 Anxiety disorder, unspecified: ICD-10-CM

## 2016-11-18 DIAGNOSIS — J329 Chronic sinusitis, unspecified: ICD-10-CM

## 2016-11-18 DIAGNOSIS — Z789 Other specified health status: ICD-10-CM

## 2016-11-18 DIAGNOSIS — K219 Gastro-esophageal reflux disease without esophagitis: ICD-10-CM

## 2016-11-18 MED ORDER — CIPROFLOXACIN HCL 0.3 % OP DROP
3 [drp] | Freq: Two times a day (BID) | OPHTHALMIC | 0 refills | 17.00000 days | Status: AC
Start: 2016-11-18 — End: 2016-12-17

## 2016-11-18 MED ORDER — HYDROCODONE-ACETAMINOPHEN 5-325 MG PO TAB
1 | ORAL_TABLET | ORAL | 0 refills | 15.00000 days | Status: AC | PRN
Start: 2016-11-18 — End: 2019-01-29

## 2016-11-18 MED ORDER — BUDESONIDE 32 MCG/ACTUATION NA SPRY
1 | Freq: Every day | NASAL | 11 refills | Status: AC
Start: 2016-11-18 — End: 2019-01-29

## 2016-11-18 MED ORDER — OXYCODONE 5 MG PO TAB
5 mg | ORAL_TABLET | ORAL | 0 refills | 6.00000 days | Status: AC | PRN
Start: 2016-11-18 — End: 2019-01-29
  Filled 2016-11-18 (×2): qty 5, 2d supply, fill #1

## 2016-11-18 NOTE — Progress Notes
Date of Service: 11/18/2016    Subjective:             Jonathon Simon is a 21 y.o. male.    History of Present Illness    Post op sinus surgery  Doing well  Headache improving but still present  Mucous drainage, nothing clear or persistent  Some blurry vision but resolves quickly       Review of Systems   Constitutional: Negative.    HENT: Positive for nosebleeds.    Eyes: Positive for photophobia, pain, discharge, redness and visual disturbance.   Respiratory: Negative.    Cardiovascular: Negative.    Gastrointestinal: Negative.    Endocrine: Negative.    Genitourinary: Negative.    Musculoskeletal: Negative.    Skin: Negative.    Allergic/Immunologic: Negative.    Neurological: Positive for headaches.   Hematological: Negative.    Psychiatric/Behavioral: Negative.          Objective:         ??? alprazolam (XANAX PO) Take 0.25 mg by mouth.   ??? AMOXICILLIN PO Take  by mouth.   ??? cetirizine (ZYRTEC) 10 mg tablet Take 1 tablet by mouth every morning.   ??? docosahexanoic acid/epa (FISH OIL PO) Take  by mouth.   ??? fluticasone (FLONASE) 50 mcg/actuation nasal spray Apply  to each nostril as directed daily. Shake bottle gently before using.   ??? homeopathic drugs (ARNICA ESSENCE PO) Take  by mouth.   ??? montelukast (SINGULAIR) 10 mg tablet Take 1 tablet by mouth at bedtime daily.   ??? morphine IR (MS-IR) 15 mg tablet Take 15 mg by mouth every 4 hours as needed for Pain   ??? MULTIVITAMIN PO Take 1 tablet by mouth daily.   ??? other medication 1 Dose. Comfrey Oil   ??? other medication 1 Dose. Phospitor   ??? other medication 1 Dose. Glutacor   ??? oxyCODONE (ROXICODONE, OXY-IR) 5 mg tablet Take 1 tablet by mouth three times daily for 3 days, then 1 tablet twice daily for 3 days, then 1 tablet once daily for 3 days and 1/2 (one-half) tablet daily for 3 days.   ??? polyethylene glycol 3350 (MIRALAX) 17 g packet Take 17 g by mouth daily.   ??? prednisone (DELTASONE) 10 mg tablet Take 4 tablets (40 mg) daily for 4 days THEN Take 3 tablets (30 mg) daily for 4 days THEN  Take 2 tablets (20 mg) daily for 4 days THEN  Take 1 tablet (10 mg) daily for 4 days   ??? sinus rinse pack kit Apply 1 packet into nose as directed as Needed.     Vitals:    11/18/16 1526   BP: 121/83   Pulse: 65   Weight: 102.8 kg (226 lb 9.6 oz)   Height: 182.9 cm (72)     Body mass index is 30.73 kg/m???.     Physical Exam    General:  Well-developed, well-nourished   Inspection:  Normocephalic and atraumatic without mass or lesion  Palpation:  Facial skeleton intact without bony stepoffs  Facial Strength:  Facial motility symmetric and full bilaterally  Tympanic Membrane:  Normal bilaterally  External nose:  No scar or anatomic deformity  Internal Nose:  Septum intact with mild S-shaped septal deviation with spur.  + edema, + polyp, no rhinorrhea.  Oral cavity, Lips, Teeth, and Gums:  Mucosa and teeth intact and viable, No lesions, masses or ulcers  Oropharynx: No erythema or exudate, no masses or ulcerations, non-obstructive tonsils  Larynx:  Normal voice, no stridor or stertor.    Neck, Trachea, Lymphatics:  Midline trachea without mass or lesion, no lymphadenopathy  Eyes: No nystagmus with equal extraocular motion bilaterally  Cranial nerves I-XII are intact    Nasal Endoscopy: Patient is anesthetized with 4% lidocaine and decongested with neosynephrine.  Rigid endoscopy is performed; the patient tolerated the procedure well. Debridement is performed with 0 and 30 degree endoscopes.  Straight and 70 and 90 degree suctions are used as well as the malleable suctions for the frontal sinuses and maxillary sinuses.  Straight and up biting Blakesley forceps are used as well as Hartmann forceps for removal of the solid debris and packing.  The bilateral sinus cavities are debrided of old blood, mucous and dissolvable packing.  There is no purulence.  The nose and sinuses are healing well.  The frontal sinuses are open and healing.     Assessment and Plan: Encounter Diagnoses   Name Primary?   ??? Nasal polyps Yes   ??? Chronic pansinusitis    ??? Mucocele of frontal sinus      Healing well from surgery  Debrided today  Start flonase  I will have him continue saline irrigations and saline gels.  rtc 2 weeks

## 2016-11-19 ENCOUNTER — Encounter: Admit: 2016-11-19 | Discharge: 2016-11-19 | Payer: MEDICAID | Primary: Adolescent Medicine

## 2016-11-20 ENCOUNTER — Encounter: Admit: 2016-11-20 | Discharge: 2016-11-20 | Payer: MEDICAID | Primary: Adolescent Medicine

## 2016-11-23 ENCOUNTER — Encounter: Admit: 2016-11-23 | Discharge: 2016-11-23 | Payer: MEDICAID | Primary: Adolescent Medicine

## 2016-11-24 ENCOUNTER — Encounter: Admit: 2016-11-24 | Discharge: 2016-11-24 | Payer: MEDICAID | Primary: Adolescent Medicine

## 2016-11-30 ENCOUNTER — Encounter: Admit: 2016-11-30 | Discharge: 2016-11-30 | Payer: MEDICAID | Primary: Adolescent Medicine

## 2016-11-30 NOTE — Telephone Encounter
Patient called for clearance for him to get massage therapy. Patient is 4 weeks out from surgery and should be okay for massage therapy. That clearance has been emailed to [employer] fisher at [email or web address].

## 2016-12-02 ENCOUNTER — Encounter: Admit: 2016-12-02 | Discharge: 2016-12-02 | Payer: MEDICAID | Primary: Adolescent Medicine

## 2016-12-02 NOTE — Telephone Encounter
Patient called stated that the past 3-4 days he has not had any smell. Per Dr Sol Blazing, flonase 2 sprays TID until FU appt on 8/30.

## 2016-12-05 ENCOUNTER — Encounter: Admit: 2016-12-05 | Discharge: 2016-12-05 | Payer: MEDICAID | Primary: Adolescent Medicine

## 2016-12-05 DIAGNOSIS — K219 Gastro-esophageal reflux disease without esophagitis: ICD-10-CM

## 2016-12-05 DIAGNOSIS — J310 Chronic rhinitis: ICD-10-CM

## 2016-12-05 DIAGNOSIS — J339 Nasal polyp, unspecified: ICD-10-CM

## 2016-12-05 DIAGNOSIS — J45909 Unspecified asthma, uncomplicated: Principal | ICD-10-CM

## 2016-12-05 DIAGNOSIS — Z789 Other specified health status: ICD-10-CM

## 2016-12-05 DIAGNOSIS — F419 Anxiety disorder, unspecified: ICD-10-CM

## 2016-12-05 DIAGNOSIS — J329 Chronic sinusitis, unspecified: ICD-10-CM

## 2016-12-07 ENCOUNTER — Encounter: Admit: 2016-12-07 | Discharge: 2016-12-07 | Payer: MEDICAID | Primary: Adolescent Medicine

## 2016-12-07 NOTE — Telephone Encounter
Patient due to see you next on 8/30. He said his eye has been lazy and hard to focus for 2 days now. He took prednisone yesterday and it went away. He also cant smell. Wondered if he needed to do something sooner than the 30th. Please advise

## 2016-12-08 NOTE — Telephone Encounter
Per Dr Sol Blazing, continue nasal steroid sprays and keep scheduled appt for eval.

## 2016-12-16 ENCOUNTER — Encounter: Admit: 2016-12-16 | Discharge: 2016-12-16 | Payer: MEDICAID | Primary: Adolescent Medicine

## 2016-12-16 ENCOUNTER — Ambulatory Visit: Admit: 2016-12-16 | Discharge: 2016-12-16 | Payer: MEDICAID | Primary: Adolescent Medicine

## 2016-12-16 DIAGNOSIS — J324 Chronic pansinusitis: ICD-10-CM

## 2016-12-16 DIAGNOSIS — J339 Nasal polyp, unspecified: Principal | ICD-10-CM

## 2016-12-16 MED ORDER — PREDNISONE 10 MG PO TAB
ORAL_TABLET | Freq: Every day | 0 refills | Status: AC
Start: 2016-12-16 — End: 2016-12-17
  Filled 2016-12-16: qty 82, 23d supply

## 2016-12-16 NOTE — Progress Notes
Date of Service: 12/16/2016    Subjective:             Jonathon Simon is a 21 y.o. male.    History of Present Illness  Jonathon Simon returns to clinic in follow up after sinus surgery on 10/29/16 for nasal polyps.     He reports that all his symptoms have returned.   His left eye is beginning swell again.   His sense of smell has diminished again.   He uses Rhinocort x3  He has been getting some colored drainage.  His asthma is controlled.  He has a history of eosinophilia.   He denies his allergies getting more severe.   He has received one shot of Harrington Challenger and is unsure how much it helped.           Review of Systems   Constitutional: Negative.    HENT: Positive for congestion and voice change.    Eyes: Positive for pain and visual disturbance.   Respiratory: Negative.    Cardiovascular: Negative.    Gastrointestinal: Negative.    Endocrine: Negative.    Genitourinary: Negative.    Musculoskeletal: Negative.    Skin: Negative.    Allergic/Immunologic: Negative.    Neurological: Positive for headaches.   Hematological: Negative.    Psychiatric/Behavioral: Negative.          Objective:         ??? alprazolam (XANAX PO) Take 0.25 mg by mouth.   ??? AMOXICILLIN PO Take  by mouth.   ??? budesonide(+) (RHINOCORT ALLERGY) 32 mcg/actuation nasal spray Apply 1 spray to each nostril as directed daily.   ??? cetirizine (ZYRTEC) 10 mg tablet Take 1 tablet by mouth every morning.   ??? ciprofloxacin(+) (CILOXIN) 0.3 % ophthalmic solution Apply 3 drops to both eyes twice daily.   ??? docosahexanoic acid/epa (FISH OIL PO) Take  by mouth.   ??? fluticasone (FLONASE) 50 mcg/actuation nasal spray Apply  to each nostril as directed daily. Shake bottle gently before using.   ??? homeopathic drugs (ARNICA ESSENCE PO) Take  by mouth.   ??? HYDROcodone/acetaminophen (NORCO) 5/325 mg tablet Take 1 tablet by mouth every 6 hours as needed for Pain   ??? montelukast (SINGULAIR) 10 mg tablet Take 1 tablet by mouth at bedtime daily. ??? morphine IR (MS-IR) 15 mg tablet Take 15 mg by mouth every 4 hours as needed for Pain   ??? MULTIVITAMIN PO Take 1 tablet by mouth daily.   ??? other medication 1 Dose. Comfrey Oil   ??? other medication 1 Dose. Phospitor   ??? other medication 1 Dose. Glutacor   ??? oxyCODONE (ROXICODONE, OXY-IR) 5 mg tablet Take 1 tablet by mouth every 6 hours as needed for Pain   ??? polyethylene glycol 3350 (MIRALAX) 17 g packet Take 17 g by mouth daily.   ??? prednisone (DELTASONE) 10 mg tablet Take 4 tablets (40 mg) daily for 4 days THEN  Take 3 tablets (30 mg) daily for 4 days THEN  Take 2 tablets (20 mg) daily for 4 days THEN  Take 1 tablet (10 mg) daily for 4 days   ??? sinus rinse pack kit Apply 1 packet into nose as directed as Needed.     Vitals:    12/16/16 1540   BP: 119/83   Pulse: 73   Weight: 106.6 kg (235 lb)   Height: 182.9 cm (72)     Body mass index is 31.87 kg/m???.     Physical Exam  General:  Well-developed, well-nourished   Inspection:  Normocephalic and atraumatic without mass or lesion  Palpation:  Facial skeleton intact without bony stepoffs  Facial Strength:  Facial motility symmetric and full bilaterally  Tympanic Membrane:  Normal bilaterally  External nose:  No scar or anatomic deformity  Internal Nose:  Septum intact with mild S-shaped septal deviation with spur.  No edema, polyp, or rhinorrhea.  Oral cavity, Lips, Teeth, and Gums:  Mucosa and teeth intact and viable, No lesions, masses or ulcers  Oropharynx: No erythema or exudate, no masses or ulcerations, non-obstructive tonsils  Larynx:  Normal voice, no stridor or stertor.    Neck, Trachea, Lymphatics:  Midline trachea without mass or lesion, no lymphadenopathy  Eyes: No nystagmus with equal extraocular motion bilaterally  Cranial nerves I-XII are intact    Nasal Endoscopy: Patient is anesthetized with 4% lidocaine and decongested with neosynephrine.  Rigid endoscopy is performed; the patient tolerated the procedure well.  There is a mild septal deviation.  There is global boggy mucosal edema.  There is evidence of prior sinus surgery.  All sinuses appear open.  There are obstructive polyps within the bilateral ethmoid cavities.  The bilateral osteomeatal complexes are unobstructed and the face of sphenoid is normal bilaterally.  There are no masses, no purulence.  The nasopharynx is normal.  Suctioned significant amount of thick mucopurulence.        Assessment and Plan:  Encounter Diagnoses   Name Primary?   ??? Nasal polyps Yes   ??? Chronic pansinusitis        His polyps have reoccurred.   Oral Steroids are the most likely option to give him some relief of symptoms.   Will prescribe him high dose prednisone taper for now.   We discussed enrolling him in the OSTRO study.    RTC 3-4 weeks                        In the presence of Lamar Benes, MD,  I have taken down these notes, Cormac Prosser, Scribe. 12/16/2016 4:27 PM

## 2016-12-17 ENCOUNTER — Encounter: Admit: 2016-12-17 | Discharge: 2016-12-17 | Payer: MEDICAID | Primary: Adolescent Medicine

## 2016-12-17 MED ORDER — FLUTICASONE 50 MCG/ACTUATION NA SPSN
2 | Freq: Every day | NASAL | 3 refills | 60.00000 days | Status: AC
Start: 2016-12-17 — End: 2017-03-21

## 2016-12-17 MED ORDER — PREDNISONE 10 MG PO TAB
ORAL_TABLET | Freq: Every day | 0 refills | Status: AC
Start: 2016-12-17 — End: 2017-03-21

## 2016-12-17 NOTE — Telephone Encounter
Patient prefers to pick up scripts at [facility] , so scripts cancelled at [facility] and resent to [facility]

## 2016-12-27 ENCOUNTER — Encounter: Admit: 2016-12-27 | Discharge: 2016-12-27 | Payer: MEDICAID | Primary: Adolescent Medicine

## 2016-12-27 MED ORDER — CIPROFLOXACIN HCL 0.3 % OP DROP
3 [drp] | Freq: Two times a day (BID) | OPHTHALMIC | 0 refills | Status: CN
Start: 2016-12-27 — End: ?

## 2016-12-27 NOTE — Telephone Encounter
Patient wanted some sort of documentation about his current health status. Advised that no letter would be written for the study until he is actually screened and qualified for it. Advised the last chart note could be printed out and will be left at the front desk

## 2016-12-28 ENCOUNTER — Encounter: Admit: 2016-12-28 | Discharge: 2016-12-28 | Payer: MEDICAID | Primary: Adolescent Medicine

## 2016-12-29 ENCOUNTER — Encounter: Admit: 2016-12-29 | Discharge: 2016-12-29 | Payer: MEDICAID | Primary: Adolescent Medicine

## 2016-12-29 MED ORDER — CIPROFLOXACIN HCL 0.3 % OP DROP
Freq: Two times a day (BID) | OPHTHALMIC | 2 refills | 17.00000 days | Status: AC
Start: 2016-12-29 — End: 2019-01-29

## 2016-12-29 NOTE — Telephone Encounter
Calling to discuss eye drops

## 2016-12-29 NOTE — Telephone Encounter
lvm stating that the eye drop Dr Sol BlazingBeahm prescribed was an abx eye drop bc patient stated his eye was crusting over. Explained he needs to clarify what his eye problem is. He is to call back when he can

## 2016-12-29 NOTE — Telephone Encounter
Crusting occurs in the morning sometimes with his eye. He used abx eye drops last time and it worked. Advised to only use BID for 1 week if infected. Rx sent for refill of eye drops

## 2016-12-30 NOTE — Telephone Encounter
Ciprofloxacin opthalmis is on back order. Script changed to ocuflox

## 2016-12-31 MED ORDER — CIPROFLOXACIN HCL 0.3 % OP DROP
3 [drp] | Freq: Two times a day (BID) | OPHTHALMIC | 0 refills
Start: 2016-12-31 — End: ?

## 2017-01-13 ENCOUNTER — Ambulatory Visit: Admit: 2017-01-13 | Discharge: 2017-01-13 | Payer: MEDICAID | Primary: Adolescent Medicine

## 2017-01-13 DIAGNOSIS — J324 Chronic pansinusitis: ICD-10-CM

## 2017-01-13 DIAGNOSIS — J339 Nasal polyp, unspecified: Principal | ICD-10-CM

## 2017-01-13 MED ORDER — PREDNISONE 10 MG PO TAB
ORAL_TABLET | Freq: Every day | 0 refills | Status: AC
Start: 2017-01-13 — End: 2019-01-29

## 2017-01-13 NOTE — Progress Notes
Date of Service: 01/13/2017    Subjective:             Jonathon Simon is a 21 y.o. male.    History of Present Illness    Felt good on steroids  States about 1 week after starting to have increased pressure and mucous  States eye seems to get puffy now (left)  States increasing headache  Starting to lose sense of smell       Review of Systems   Constitutional: Negative.    HENT: Negative.    Eyes: Positive for visual disturbance.   Respiratory: Negative.    Cardiovascular: Negative.    Gastrointestinal: Negative.    Endocrine: Negative.    Genitourinary: Negative.    Musculoskeletal: Negative.    Skin: Negative.    Allergic/Immunologic: Negative.    Neurological: Positive for headaches.   Hematological: Negative.    Psychiatric/Behavioral: Negative.          Objective:         ??? alprazolam (XANAX PO) Take 0.25 mg by mouth.   ??? AMOXICILLIN PO Take  by mouth.   ??? budesonide(+) (RHINOCORT ALLERGY) 32 mcg/actuation nasal spray Apply 1 spray to each nostril as directed daily.   ??? cetirizine (ZYRTEC) 10 mg tablet Take 1 tablet by mouth every morning.   ??? ciprofloxacin(+) (CILOXIN) 0.3 % ophthalmic solution 2 drops to affected eye BID for 7 days when infected   ??? docosahexanoic acid/epa (FISH OIL PO) Take  by mouth.   ??? fluticasone (FLONASE) 50 mcg/actuation nasal spray Apply two sprays to each nostril as directed daily. Shake bottle gently before using.   ??? fluticasone (FLONASE) 50 mcg/actuation nasal spray Apply  to each nostril as directed daily. Shake bottle gently before using.   ??? homeopathic drugs (ARNICA ESSENCE PO) Take  by mouth.   ??? HYDROcodone/acetaminophen (NORCO) 5/325 mg tablet Take 1 tablet by mouth every 6 hours as needed for Pain   ??? montelukast (SINGULAIR) 10 mg tablet Take 1 tablet by mouth at bedtime daily.   ??? morphine IR (MS-IR) 15 mg tablet Take 15 mg by mouth every 4 hours as needed for Pain   ??? MULTIVITAMIN PO Take 1 tablet by mouth daily.   ??? other medication 1 Dose. Comfrey Oil ??? other medication 1 Dose. Phospitor   ??? other medication 1 Dose. Glutacor   ??? oxyCODONE (ROXICODONE, OXY-IR) 5 mg tablet Take 1 tablet by mouth every 6 hours as needed for Pain   ??? polyethylene glycol 3350 (MIRALAX) 17 g packet Take 17 g by mouth daily.   ??? prednisone (DELTASONE) 10 mg tablet Take 6 tabs daily for 7 days, then 4 tabs for 4 days, then 3 tabs for 4 days, then 2 tabs for 4 days, then 1 tabs for 4 days   ??? sinus rinse pack kit Apply 1 packet into nose as directed as Needed.     Vitals:    01/13/17 1059   BP: 129/87   Pulse: 96   Weight: 108.9 kg (240 lb)   Height: 182.9 cm (72)     Body mass index is 32.55 kg/m???.     Physical Exam    General:  Well-developed, well-nourished   Inspection:  Normocephalic and atraumatic without mass or lesion  Palpation:  Facial skeleton intact without bony stepoffs  Facial Strength:  Facial motility symmetric and full bilaterally  Tympanic Membrane:  Normal bilaterally  External nose:  No scar or anatomic deformity  Internal Nose:  Septum intact with mild S-shaped septal deviation with spur.  + edema, + polyp, thick rhinorrhea.  Oral cavity, Lips, Teeth, and Gums:  Mucosa and teeth intact and viable, No lesions, masses or ulcers  Oropharynx: No erythema or exudate, no masses or ulcerations, non-obstructive tonsils  Larynx:  Normal voice, no stridor or stertor.    Neck, Trachea, Lymphatics:  Midline trachea without mass or lesion, no lymphadenopathy  Eyes: No nystagmus with equal extraocular motion bilaterally  Cranial nerves II-XII are intact    Nasal Endoscopy: Patient is anesthetized with 4% lidocaine and decongested with neosynephrine.  Rigid endoscopy is performed; the patient tolerated the procedure well.  There is a mild septal deviation.  There is global boggy mucosal edema.  There is evidence of prior sinus surgery.  There are grade 2 obstructive polyps within the bilateral ethmoid cavities.  The bilateral maxillary sinuses are unobstructed and the face of sphenoid has polyps changes bilaterally.  The frontal recess is filled with polyps bilaterally.  There are no masses, no purulence but thick allergic mucin is suctioned from the sinses.  The nasopharynx is normal.         Assessment and Plan:  Encounter Diagnoses   Name Primary?   ??? Nasal polyps Yes   ??? Chronic pansinusitis      He did quite well on steroids and states smell returned and no drainage at all, but now having increasing symptoms again.  On exam there are grade 2 almost grade 3 polyps returning.    We discussed the severity of his disease and that this is certainly less usual  We discussed trying to wait on oral steroids for now due to the long term side effects and his significant exposure already  We discussed that he may be eligible for evaluation for the OSTRO trial, but will need to be 3 months out from surgery and off steroids (oral) for 1 month  For now use flonase  Will try to get him pulmicort  May consider vasculitis workup given severity of disease (EGPA?)    I sent script in for prednisone if he acutely decompensates but I instructed him to call me prior to taking

## 2017-01-14 ENCOUNTER — Encounter: Admit: 2017-01-14 | Discharge: 2017-01-14 | Payer: MEDICAID | Primary: Adolescent Medicine

## 2017-01-14 DIAGNOSIS — J339 Nasal polyp, unspecified: ICD-10-CM

## 2017-01-14 DIAGNOSIS — K219 Gastro-esophageal reflux disease without esophagitis: ICD-10-CM

## 2017-01-14 DIAGNOSIS — J310 Chronic rhinitis: ICD-10-CM

## 2017-01-14 DIAGNOSIS — J329 Chronic sinusitis, unspecified: ICD-10-CM

## 2017-01-14 DIAGNOSIS — F419 Anxiety disorder, unspecified: ICD-10-CM

## 2017-01-14 DIAGNOSIS — Z789 Other specified health status: ICD-10-CM

## 2017-01-14 DIAGNOSIS — J45909 Unspecified asthma, uncomplicated: Principal | ICD-10-CM

## 2017-01-25 ENCOUNTER — Encounter: Admit: 2017-01-25 | Discharge: 2017-01-25 | Payer: MEDICAID | Primary: Adolescent Medicine

## 2017-01-25 DIAGNOSIS — J339 Nasal polyp, unspecified: ICD-10-CM

## 2017-01-25 DIAGNOSIS — K219 Gastro-esophageal reflux disease without esophagitis: ICD-10-CM

## 2017-01-25 DIAGNOSIS — J45909 Unspecified asthma, uncomplicated: Principal | ICD-10-CM

## 2017-01-25 DIAGNOSIS — Z789 Other specified health status: ICD-10-CM

## 2017-01-25 DIAGNOSIS — F419 Anxiety disorder, unspecified: ICD-10-CM

## 2017-01-25 DIAGNOSIS — J310 Chronic rhinitis: ICD-10-CM

## 2017-01-25 DIAGNOSIS — J329 Chronic sinusitis, unspecified: ICD-10-CM

## 2017-02-16 ENCOUNTER — Encounter: Admit: 2017-02-16 | Discharge: 2017-02-16 | Payer: MEDICAID | Primary: Adolescent Medicine

## 2017-03-16 ENCOUNTER — Emergency Department: Admit: 2017-03-16 | Discharge: 2017-03-16 | Disposition: A | Discharge status: Home / Self Care

## 2017-03-16 ENCOUNTER — Emergency Department: Admit: 2017-03-16 | Discharge: 2017-03-16 | Payer: MEDICAID

## 2017-03-16 ENCOUNTER — Encounter: Admit: 2017-03-16 | Discharge: 2017-03-16 | Payer: MEDICAID | Primary: Adolescent Medicine

## 2017-03-16 DIAGNOSIS — S0083XA Contusion of other part of head, initial encounter: ICD-10-CM

## 2017-03-16 DIAGNOSIS — S79921A Unspecified injury of right thigh, initial encounter: ICD-10-CM

## 2017-03-16 DIAGNOSIS — S00511A Abrasion of lip, initial encounter: ICD-10-CM

## 2017-03-16 DIAGNOSIS — S299XXA Unspecified injury of thorax, initial encounter: Principal | ICD-10-CM

## 2017-03-16 MED ORDER — ONDANSETRON HCL 4 MG PO TAB
4 mg | ORAL_TABLET | ORAL | 0 refills | 8.00000 days | Status: AC | PRN
Start: 2017-03-16 — End: 2019-01-29

## 2017-03-16 MED ORDER — KETOROLAC 60 MG/2 ML IM SOLN
60 mg | Freq: Once | INTRAMUSCULAR | 0 refills | Status: DC
Start: 2017-03-16 — End: 2017-03-16

## 2017-03-16 MED ORDER — NAPROXEN 500 MG PO TAB
500 mg | ORAL_TABLET | Freq: Two times a day (BID) | ORAL | 0 refills | Status: AC
Start: 2017-03-16 — End: 2019-01-29

## 2017-03-16 NOTE — ED Notes
Pt refused MD offer for Naproxen and anti-nausea meds. Pt continuing to request oxy and tramadol -  reporting they have given them to him before and that everything else makes him throw up.  Pt now wanting to accept the prescriptions that Dr. Scharlene Cornhom offered. MD informed.

## 2017-03-16 NOTE — ED Notes
Pt received Rx and instructions for discharge. All questions and concerns were addressed. Pt had an understanding of the instructions. Pt was in stable condition, A&Ox4, VSS and able to leave on their own accord via ambulation.

## 2017-03-21 ENCOUNTER — Encounter: Admit: 2017-03-21 | Discharge: 2017-03-21 | Payer: MEDICAID | Primary: Adolescent Medicine

## 2017-03-21 ENCOUNTER — Ambulatory Visit: Admit: 2017-03-21 | Discharge: 2017-03-21 | Payer: MEDICAID | Primary: Adolescent Medicine

## 2017-03-21 DIAGNOSIS — J324 Chronic pansinusitis: ICD-10-CM

## 2017-03-21 DIAGNOSIS — J339 Nasal polyp, unspecified: Principal | ICD-10-CM

## 2017-03-21 DIAGNOSIS — J341 Cyst and mucocele of nose and nasal sinus: ICD-10-CM

## 2017-03-21 DIAGNOSIS — H539 Unspecified visual disturbance: ICD-10-CM

## 2017-03-21 DIAGNOSIS — J310 Chronic rhinitis: ICD-10-CM

## 2017-03-21 DIAGNOSIS — K219 Gastro-esophageal reflux disease without esophagitis: ICD-10-CM

## 2017-03-21 DIAGNOSIS — J329 Chronic sinusitis, unspecified: ICD-10-CM

## 2017-03-21 DIAGNOSIS — J45909 Unspecified asthma, uncomplicated: Principal | ICD-10-CM

## 2017-03-21 DIAGNOSIS — F419 Anxiety disorder, unspecified: ICD-10-CM

## 2017-03-21 DIAGNOSIS — Z789 Other specified health status: ICD-10-CM

## 2017-03-21 MED ORDER — FLUTICASONE 50 MCG/ACTUATION NA SPSN
2 | Freq: Every day | NASAL | 3 refills | 60.00000 days | Status: AC
Start: 2017-03-21 — End: 2019-01-29

## 2017-03-21 MED ORDER — PREDNISONE 10 MG PO TAB
ORAL_TABLET | Freq: Every day | 0 refills | Status: AC
Start: 2017-03-21 — End: 2018-01-20
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
## 2017-03-25 ENCOUNTER — Encounter: Admit: 2017-03-25 | Discharge: 2017-03-25 | Payer: MEDICAID | Primary: Adolescent Medicine

## 2017-03-25 DIAGNOSIS — Z789 Other specified health status: ICD-10-CM

## 2017-03-25 DIAGNOSIS — F419 Anxiety disorder, unspecified: ICD-10-CM

## 2017-03-25 DIAGNOSIS — K219 Gastro-esophageal reflux disease without esophagitis: ICD-10-CM

## 2017-03-25 DIAGNOSIS — J339 Nasal polyp, unspecified: ICD-10-CM

## 2017-03-25 DIAGNOSIS — J310 Chronic rhinitis: ICD-10-CM

## 2017-03-25 DIAGNOSIS — J329 Chronic sinusitis, unspecified: ICD-10-CM

## 2017-03-25 DIAGNOSIS — J45909 Unspecified asthma, uncomplicated: Principal | ICD-10-CM

## 2017-04-04 ENCOUNTER — Ambulatory Visit: Admit: 2017-04-04 | Discharge: 2017-04-04 | Payer: MEDICAID | Primary: Adolescent Medicine

## 2017-04-04 DIAGNOSIS — J339 Nasal polyp, unspecified: Principal | ICD-10-CM

## 2017-04-04 DIAGNOSIS — J324 Chronic pansinusitis: ICD-10-CM

## 2017-04-04 NOTE — Progress Notes
Date of Service: 04/04/2017    Subjective:             Jonathon Simon is a 21 y.o. male.    History of Present Illness  Jonathon Simon returns to clinic for follow up of nasal polyps and chronic pansinusitis.     He says that he can feel things moving around in his nose and sinuses more. He is getting more rhinorrhea, clear. Continues with sinus pressure and pain. When he lays down will get more frontal pressure and then sits up will feel the drainage from the frontal sinus all the way down. Thinks he is able to concentrate more now that there is less pressure on his head. As well notices more mood swings, could be from the steroids.     Currently at 30mg  a day prednisone. Continues with nasal saline irrigations. Able to get more debris/drainage out now that the swelling has gone down.        Review of Systems   Constitutional: Negative.    HENT: Negative.    Eyes: Negative.    Respiratory: Negative.    Cardiovascular: Negative.    Gastrointestinal: Negative.    Endocrine: Negative.    Genitourinary: Negative.    Musculoskeletal: Negative.    Skin: Negative.    Allergic/Immunologic: Negative.    Neurological: Positive for headaches.   Hematological: Negative.    Psychiatric/Behavioral: Negative.    All other systems reviewed and are negative.        Objective:         ??? alprazolam (XANAX PO) Take 0.25 mg by mouth.   ??? AMOXICILLIN PO Take  by mouth.   ??? budesonide(+) (RHINOCORT ALLERGY) 32 mcg/actuation nasal spray Apply 1 spray to each nostril as directed daily.   ??? cetirizine (ZYRTEC) 10 mg tablet Take 1 tablet by mouth every morning.   ??? ciprofloxacin(+) (CILOXIN) 0.3 % ophthalmic solution 2 drops to affected eye BID for 7 days when infected   ??? docosahexanoic acid/epa (FISH OIL PO) Take  by mouth.   ??? fluticasone (FLONASE) 50 mcg/actuation nasal spray Apply two sprays to each nostril as directed daily. Shake bottle gently before using. ??? fluticasone (FLONASE) 50 mcg/actuation nasal spray Apply  to each nostril as directed daily. Shake bottle gently before using.   ??? homeopathic drugs (ARNICA ESSENCE PO) Take  by mouth.   ??? HYDROcodone/acetaminophen (NORCO) 5/325 mg tablet Take 1 tablet by mouth every 6 hours as needed for Pain   ??? montelukast (SINGULAIR) 10 mg tablet Take 1 tablet by mouth at bedtime daily.   ??? morphine IR (MS-IR) 15 mg tablet Take 15 mg by mouth every 4 hours as needed for Pain   ??? MULTIVITAMIN PO Take 1 tablet by mouth daily.   ??? naproxen (NAPROSYN) 500 mg tablet Take one tablet by mouth twice daily with meals. Take with food.   ??? ondansetron (ZOFRAN) 4 mg tablet Take one tablet by mouth every 8 hours as needed for Nausea.   ??? other medication 1 Dose. Comfrey Oil   ??? other medication 1 Dose. Phospitor   ??? other medication 1 Dose. Glutacor   ??? oxyCODONE (ROXICODONE, OXY-IR) 5 mg tablet Take 1 tablet by mouth every 6 hours as needed for Pain   ??? polyethylene glycol 3350 (MIRALAX) 17 g packet Take 17 g by mouth daily.   ??? prednisone (DELTASONE) 10 mg tablet Take 6 tabs daily for 7 days, then 4 tabs for 4 days, then 3  tabs for 4 days, then 2 tabs for 4 days, then 1 tabs for 4 days   ??? prednisone (DELTASONE) 10 mg tablet Take 5 tabs daily for 4 days. Then 4 tabs for 4 days, then 3 tabs for 4 days, then 2 tabs for 4 days, then 1 tab for 4 days   ??? sinus rinse pack kit Apply 1 packet into nose as directed as Needed.     Vitals:    04/04/17 1601   BP: 125/80   Pulse: 70   Weight: 104.3 kg (230 lb)   Height: 180.3 cm (71)     Body mass index is 32.08 kg/m???.     Physical Exam  General:  Well-developed, well-nourished   Inspection:  Normocephalic and atraumatic without mass or lesion  Palpation:  Facial skeleton intact without bony stepoffs  Facial Strength:  Facial motility symmetric and full bilaterally  Tympanic Membrane:  Normal bilaterally  External nose:  No scar or anatomic deformity Internal Nose:  Septum intact with mild S-shaped septal deviation with spur.  No edema, polyp, or rhinorrhea.  Oral cavity, Lips, Teeth, and Gums:  Mucosa and teeth intact and viable, No lesions, masses or ulcers  Oropharynx: No erythema or exudate, no masses or ulcerations, non-obstructive tonsils  Larynx:  Normal voice, no stridor or stertor.    Neck, Trachea, Lymphatics:  Midline trachea without mass or lesion, no lymphadenopathy  Eyes: No nystagmus with equal extraocular motion bilaterally  Cranial nerves I-XII are intact    Nasal Endoscopy: Patient is anesthetized with 4% lidocaine and decongested with neosynephrine.  Rigid endoscopy is performed; the patient tolerated the procedure well.  There is a mild septal deviation.  There is global boggy mucosal edema.  There is evidence of prior sinus surgery.  All sinuses appear open.  There are Grade 3 polyps within the bilateral ethmoid cavities.  The bilateral osteomeatal complexes are unobstructed and the face of sphenoid is normal bilaterally.  There are no masses, no purulence.  The nasopharynx is normal.           Assessment and Plan:  Encounter Diagnoses   Name Primary?   ??? Nasal polyps Yes   ??? Chronic pansinusitis        He has shown improvement of the polyps with the prednisone. He should continue with the nasal saline irrigations.     Would still see if he can collect some of the drainage to test for CSF.     Discussed use of a salicylate diet due to the AERD symptoms he has had in the past.     Can send in a psychiatry referral for evaluation per his request    RTC 3 weeks.                       In the presence of Lamar Benes, MD,  I have taken down these notes, Cormac Prosser, Scribe. 04/04/2017 4:35 PM

## 2017-04-19 ENCOUNTER — Encounter: Admit: 2017-04-19 | Discharge: 2017-04-19 | Payer: MEDICAID | Primary: Adolescent Medicine

## 2017-04-19 DIAGNOSIS — Z789 Other specified health status: ICD-10-CM

## 2017-04-19 DIAGNOSIS — J45909 Unspecified asthma, uncomplicated: Principal | ICD-10-CM

## 2017-04-19 DIAGNOSIS — J310 Chronic rhinitis: ICD-10-CM

## 2017-04-19 DIAGNOSIS — F419 Anxiety disorder, unspecified: ICD-10-CM

## 2017-04-19 DIAGNOSIS — K219 Gastro-esophageal reflux disease without esophagitis: ICD-10-CM

## 2017-04-19 DIAGNOSIS — J329 Chronic sinusitis, unspecified: ICD-10-CM

## 2017-04-19 DIAGNOSIS — J339 Nasal polyp, unspecified: ICD-10-CM

## 2017-05-11 ENCOUNTER — Encounter: Admit: 2017-05-11 | Discharge: 2017-05-12 | Primary: Adolescent Medicine

## 2017-06-25 ENCOUNTER — Encounter: Admit: 2017-06-25 | Discharge: 2017-06-26 | Primary: Adolescent Medicine

## 2017-06-25 ENCOUNTER — Encounter: Admit: 2017-06-25 | Discharge: 2017-06-25 | Primary: Adolescent Medicine

## 2017-07-19 ENCOUNTER — Encounter: Admit: 2017-07-19 | Discharge: 2017-07-20 | Primary: Adolescent Medicine

## 2017-07-20 ENCOUNTER — Encounter: Admit: 2017-07-20 | Discharge: 2017-07-21 | Primary: Adolescent Medicine

## 2017-07-21 ENCOUNTER — Encounter: Admit: 2017-07-21 | Discharge: 2017-07-21 | Payer: MEDICAID | Primary: Adolescent Medicine

## 2017-07-21 DIAGNOSIS — J45909 Unspecified asthma, uncomplicated: Principal | ICD-10-CM

## 2017-07-21 DIAGNOSIS — Z789 Other specified health status: ICD-10-CM

## 2017-07-21 DIAGNOSIS — F419 Anxiety disorder, unspecified: ICD-10-CM

## 2017-07-21 DIAGNOSIS — J310 Chronic rhinitis: ICD-10-CM

## 2017-07-21 DIAGNOSIS — J339 Nasal polyp, unspecified: ICD-10-CM

## 2017-07-21 DIAGNOSIS — K219 Gastro-esophageal reflux disease without esophagitis: ICD-10-CM

## 2017-07-21 DIAGNOSIS — J329 Chronic sinusitis, unspecified: ICD-10-CM

## 2017-08-01 ENCOUNTER — Encounter: Admit: 2017-08-01 | Discharge: 2017-08-01 | Payer: MEDICAID | Primary: Adolescent Medicine

## 2017-08-01 ENCOUNTER — Ambulatory Visit: Admit: 2017-08-01 | Discharge: 2017-08-01 | Payer: MEDICAID | Primary: Adolescent Medicine

## 2017-08-01 DIAGNOSIS — J324 Chronic pansinusitis: ICD-10-CM

## 2017-08-01 DIAGNOSIS — J341 Cyst and mucocele of nose and nasal sinus: ICD-10-CM

## 2017-08-01 DIAGNOSIS — J339 Nasal polyp, unspecified: Principal | ICD-10-CM

## 2017-08-01 MED ORDER — PREDNISONE 20 MG PO TAB
ORAL_TABLET | Freq: Every day | 0 refills | Status: AC
Start: 2017-08-01 — End: 2018-01-20
  Filled 2017-08-01 (×2): qty 90, 30d supply, fill #1

## 2017-08-01 MED ORDER — MONTELUKAST 10 MG PO TAB
10 mg | ORAL_TABLET | Freq: Every evening | ORAL | 3 refills | 90.00000 days | Status: AC
Start: 2017-08-01 — End: 2019-01-29
  Filled 2017-08-01 (×2): qty 90, 90d supply, fill #1

## 2017-08-03 ENCOUNTER — Encounter: Admit: 2017-08-03 | Discharge: 2017-08-03 | Payer: MEDICAID | Primary: Adolescent Medicine

## 2017-08-04 ENCOUNTER — Encounter: Admit: 2017-08-04 | Discharge: 2017-08-04 | Payer: MEDICAID | Primary: Adolescent Medicine

## 2017-08-31 ENCOUNTER — Encounter: Admit: 2017-08-31 | Discharge: 2017-08-31 | Payer: MEDICAID | Primary: Adolescent Medicine

## 2017-09-07 ENCOUNTER — Encounter: Admit: 2017-09-07 | Discharge: 2017-09-07 | Payer: MEDICAID | Primary: Adolescent Medicine

## 2017-09-07 ENCOUNTER — Ambulatory Visit: Admit: 2017-09-07 | Discharge: 2017-09-07 | Payer: MEDICAID | Primary: Adolescent Medicine

## 2017-09-07 DIAGNOSIS — J310 Chronic rhinitis: ICD-10-CM

## 2017-09-07 DIAGNOSIS — J339 Nasal polyp, unspecified: Principal | ICD-10-CM

## 2017-09-07 DIAGNOSIS — J45909 Unspecified asthma, uncomplicated: Principal | ICD-10-CM

## 2017-09-07 DIAGNOSIS — J329 Chronic sinusitis, unspecified: ICD-10-CM

## 2017-09-07 DIAGNOSIS — K219 Gastro-esophageal reflux disease without esophagitis: ICD-10-CM

## 2017-09-07 DIAGNOSIS — J324 Chronic pansinusitis: ICD-10-CM

## 2017-09-07 DIAGNOSIS — F419 Anxiety disorder, unspecified: ICD-10-CM

## 2017-09-07 DIAGNOSIS — Z789 Other specified health status: ICD-10-CM

## 2017-09-07 MED ORDER — PREDNISONE 10 MG PO TAB
30 mg | ORAL_TABLET | Freq: Every day | ORAL | 0 refills | Status: AC
Start: 2017-09-07 — End: 2019-01-29
  Filled 2017-09-07: qty 90, 30d supply

## 2017-09-15 ENCOUNTER — Ambulatory Visit: Admit: 2017-09-15 | Discharge: 2017-09-15 | Payer: MEDICAID | Primary: Adolescent Medicine

## 2017-09-15 ENCOUNTER — Encounter: Admit: 2017-09-15 | Discharge: 2017-09-15 | Payer: MEDICAID | Primary: Adolescent Medicine

## 2017-09-15 DIAGNOSIS — J45909 Unspecified asthma, uncomplicated: Principal | ICD-10-CM

## 2017-09-15 DIAGNOSIS — J339 Nasal polyp, unspecified: ICD-10-CM

## 2017-09-15 DIAGNOSIS — T50905A Adverse effect of unspecified drugs, medicaments and biological substances, initial encounter: ICD-10-CM

## 2017-09-15 DIAGNOSIS — K219 Gastro-esophageal reflux disease without esophagitis: ICD-10-CM

## 2017-09-15 DIAGNOSIS — T781XXA Other adverse food reactions, not elsewhere classified, initial encounter: ICD-10-CM

## 2017-09-15 DIAGNOSIS — J329 Chronic sinusitis, unspecified: ICD-10-CM

## 2017-09-15 DIAGNOSIS — L2084 Intrinsic (allergic) eczema: ICD-10-CM

## 2017-09-15 DIAGNOSIS — F419 Anxiety disorder, unspecified: ICD-10-CM

## 2017-09-15 DIAGNOSIS — J310 Chronic rhinitis: ICD-10-CM

## 2017-09-15 DIAGNOSIS — J4520 Mild intermittent asthma, uncomplicated: ICD-10-CM

## 2017-09-15 DIAGNOSIS — Z789 Other specified health status: ICD-10-CM

## 2017-09-15 MED ORDER — AUVI-Q 0.3 MG/0.3 ML IJ ATIN
ORAL | 0 refills | 1.00000 days | Status: AC
Start: 2017-09-15 — End: 2018-10-04

## 2017-09-15 MED ORDER — ALBUTEROL SULFATE 90 MCG/ACTUATION IN HFAA
2 | RESPIRATORY_TRACT | 1 refills | Status: AC | PRN
Start: 2017-09-15 — End: 2019-02-06
  Filled 2017-09-15: qty 8, 17d supply

## 2017-09-17 ENCOUNTER — Encounter: Admit: 2017-09-17 | Discharge: 2017-09-17 | Payer: MEDICAID | Primary: Adolescent Medicine

## 2017-09-24 ENCOUNTER — Encounter: Admit: 2017-09-24 | Discharge: 2017-09-24 | Payer: MEDICAID | Primary: Adolescent Medicine

## 2017-09-25 ENCOUNTER — Encounter: Admit: 2017-09-25 | Discharge: 2017-09-25 | Payer: MEDICAID | Primary: Adolescent Medicine

## 2017-09-25 DIAGNOSIS — J45909 Unspecified asthma, uncomplicated: Principal | ICD-10-CM

## 2017-09-25 DIAGNOSIS — K219 Gastro-esophageal reflux disease without esophagitis: ICD-10-CM

## 2017-09-25 DIAGNOSIS — J310 Chronic rhinitis: ICD-10-CM

## 2017-09-25 DIAGNOSIS — J329 Chronic sinusitis, unspecified: ICD-10-CM

## 2017-09-25 DIAGNOSIS — F419 Anxiety disorder, unspecified: ICD-10-CM

## 2017-09-25 DIAGNOSIS — J339 Nasal polyp, unspecified: ICD-10-CM

## 2017-09-25 DIAGNOSIS — Z789 Other specified health status: ICD-10-CM

## 2017-10-02 ENCOUNTER — Encounter: Admit: 2017-10-02 | Discharge: 2017-10-02 | Payer: MEDICAID | Primary: Adolescent Medicine

## 2017-10-07 ENCOUNTER — Encounter: Admit: 2017-10-07 | Discharge: 2017-10-07 | Payer: MEDICAID | Primary: Adolescent Medicine

## 2017-10-13 ENCOUNTER — Ambulatory Visit: Admit: 2017-10-13 | Discharge: 2017-10-14 | Payer: MEDICAID | Primary: Adolescent Medicine

## 2017-10-13 ENCOUNTER — Encounter: Admit: 2017-10-13 | Discharge: 2017-10-13 | Payer: MEDICAID | Primary: Adolescent Medicine

## 2017-10-13 DIAGNOSIS — K219 Gastro-esophageal reflux disease without esophagitis: ICD-10-CM

## 2017-10-13 DIAGNOSIS — J45909 Unspecified asthma, uncomplicated: Principal | ICD-10-CM

## 2017-10-13 DIAGNOSIS — F419 Anxiety disorder, unspecified: ICD-10-CM

## 2017-10-13 DIAGNOSIS — J329 Chronic sinusitis, unspecified: ICD-10-CM

## 2017-10-13 DIAGNOSIS — J339 Nasal polyp, unspecified: ICD-10-CM

## 2017-10-13 DIAGNOSIS — Z789 Other specified health status: ICD-10-CM

## 2017-10-13 DIAGNOSIS — J310 Chronic rhinitis: ICD-10-CM

## 2017-10-13 MED ORDER — DUPILUMAB 300 MG/2 ML SC SYRG
SUBCUTANEOUS | 11 refills | 28.00000 days | Status: AC
Start: 2017-10-13 — End: 2018-10-27

## 2017-10-14 ENCOUNTER — Encounter: Admit: 2017-10-14 | Discharge: 2017-10-14 | Payer: MEDICAID | Primary: Adolescent Medicine

## 2017-10-14 DIAGNOSIS — T781XXA Other adverse food reactions, not elsewhere classified, initial encounter: ICD-10-CM

## 2017-10-14 DIAGNOSIS — J339 Nasal polyp, unspecified: Principal | ICD-10-CM

## 2017-10-14 DIAGNOSIS — J4520 Mild intermittent asthma, uncomplicated: ICD-10-CM

## 2017-10-14 DIAGNOSIS — J45909 Unspecified asthma, uncomplicated: Principal | ICD-10-CM

## 2017-10-14 DIAGNOSIS — K219 Gastro-esophageal reflux disease without esophagitis: ICD-10-CM

## 2017-10-14 DIAGNOSIS — L2084 Intrinsic (allergic) eczema: ICD-10-CM

## 2017-10-14 DIAGNOSIS — F419 Anxiety disorder, unspecified: ICD-10-CM

## 2017-10-14 DIAGNOSIS — J329 Chronic sinusitis, unspecified: ICD-10-CM

## 2017-10-14 DIAGNOSIS — Z789 Other specified health status: ICD-10-CM

## 2017-10-14 DIAGNOSIS — J310 Chronic rhinitis: ICD-10-CM

## 2017-10-19 ENCOUNTER — Encounter: Admit: 2017-10-19 | Discharge: 2017-10-19 | Payer: MEDICAID | Primary: Adolescent Medicine

## 2017-11-09 ENCOUNTER — Ambulatory Visit: Admit: 2017-11-09 | Discharge: 2017-11-10 | Payer: MEDICAID | Primary: Adolescent Medicine

## 2017-11-09 ENCOUNTER — Encounter: Admit: 2017-11-09 | Discharge: 2017-11-09 | Payer: MEDICAID | Primary: Adolescent Medicine

## 2017-11-09 DIAGNOSIS — J339 Nasal polyp, unspecified: ICD-10-CM

## 2017-11-09 DIAGNOSIS — J45909 Unspecified asthma, uncomplicated: Principal | ICD-10-CM

## 2017-11-09 DIAGNOSIS — J310 Chronic rhinitis: ICD-10-CM

## 2017-11-09 DIAGNOSIS — Z789 Other specified health status: ICD-10-CM

## 2017-11-09 DIAGNOSIS — K219 Gastro-esophageal reflux disease without esophagitis: ICD-10-CM

## 2017-11-09 DIAGNOSIS — F419 Anxiety disorder, unspecified: ICD-10-CM

## 2017-11-09 DIAGNOSIS — J329 Chronic sinusitis, unspecified: ICD-10-CM

## 2017-11-09 MED ORDER — PREDNISONE 50 MG PO TAB
25 mg | ORAL_TABLET | Freq: Every day | ORAL | 0 refills | Status: AC
Start: 2017-11-09 — End: ?
  Filled 2017-11-09 (×2): qty 15, 30d supply, fill #1

## 2017-11-09 MED ORDER — PREDNISONE 10 MG PO TAB
ORAL_TABLET | Freq: Every day | ORAL | 0 refills | Status: AC
Start: 2017-11-09 — End: ?
  Filled 2017-11-09: qty 90, 20d supply

## 2017-11-10 DIAGNOSIS — J324 Chronic pansinusitis: Principal | ICD-10-CM

## 2017-11-10 DIAGNOSIS — J339 Nasal polyp, unspecified: ICD-10-CM

## 2017-11-18 ENCOUNTER — Encounter: Admit: 2017-11-18 | Discharge: 2017-11-18 | Payer: MEDICAID | Primary: Adolescent Medicine

## 2017-12-06 ENCOUNTER — Encounter: Admit: 2017-12-06 | Discharge: 2017-12-06 | Payer: MEDICAID | Primary: Adolescent Medicine

## 2017-12-06 DIAGNOSIS — F419 Anxiety disorder, unspecified: ICD-10-CM

## 2017-12-06 DIAGNOSIS — J339 Nasal polyp, unspecified: ICD-10-CM

## 2017-12-06 DIAGNOSIS — J310 Chronic rhinitis: ICD-10-CM

## 2017-12-06 DIAGNOSIS — K219 Gastro-esophageal reflux disease without esophagitis: ICD-10-CM

## 2017-12-06 DIAGNOSIS — J329 Chronic sinusitis, unspecified: ICD-10-CM

## 2017-12-06 DIAGNOSIS — Z789 Other specified health status: ICD-10-CM

## 2017-12-06 DIAGNOSIS — J45909 Unspecified asthma, uncomplicated: Principal | ICD-10-CM

## 2017-12-07 ENCOUNTER — Encounter: Admit: 2017-12-07 | Discharge: 2017-12-07 | Payer: MEDICAID | Primary: Adolescent Medicine

## 2017-12-07 DIAGNOSIS — J310 Chronic rhinitis: ICD-10-CM

## 2017-12-07 DIAGNOSIS — J45909 Unspecified asthma, uncomplicated: Principal | ICD-10-CM

## 2017-12-07 DIAGNOSIS — J339 Nasal polyp, unspecified: ICD-10-CM

## 2017-12-07 DIAGNOSIS — K219 Gastro-esophageal reflux disease without esophagitis: ICD-10-CM

## 2017-12-07 DIAGNOSIS — Z789 Other specified health status: ICD-10-CM

## 2017-12-07 DIAGNOSIS — J329 Chronic sinusitis, unspecified: ICD-10-CM

## 2017-12-07 DIAGNOSIS — F419 Anxiety disorder, unspecified: ICD-10-CM

## 2017-12-07 MED ORDER — AUVI-Q 0.3 MG/0.3 ML IJ ATIN
ORAL | 0 refills | 1.00000 days | Status: AC
Start: 2017-12-07 — End: 2019-01-29

## 2017-12-08 ENCOUNTER — Ambulatory Visit: Admit: 2017-12-07 | Discharge: 2017-12-08 | Payer: MEDICAID | Primary: Adolescent Medicine

## 2017-12-08 DIAGNOSIS — L2084 Intrinsic (allergic) eczema: ICD-10-CM

## 2017-12-08 DIAGNOSIS — J339 Nasal polyp, unspecified: ICD-10-CM

## 2017-12-08 DIAGNOSIS — J4520 Mild intermittent asthma, uncomplicated: Principal | ICD-10-CM

## 2017-12-08 DIAGNOSIS — T781XXS Other adverse food reactions, not elsewhere classified, sequela: ICD-10-CM

## 2017-12-14 ENCOUNTER — Ambulatory Visit: Admit: 2017-12-14 | Discharge: 2017-12-15 | Payer: MEDICAID | Primary: Adolescent Medicine

## 2017-12-15 DIAGNOSIS — J324 Chronic pansinusitis: Principal | ICD-10-CM

## 2017-12-15 DIAGNOSIS — J339 Nasal polyp, unspecified: ICD-10-CM

## 2017-12-20 ENCOUNTER — Encounter: Admit: 2017-12-20 | Discharge: 2017-12-20 | Payer: MEDICAID | Primary: Adolescent Medicine

## 2017-12-22 ENCOUNTER — Encounter: Admit: 2017-12-22 | Discharge: 2017-12-22 | Payer: MEDICAID | Primary: Adolescent Medicine

## 2018-01-20 ENCOUNTER — Encounter: Admit: 2018-01-20 | Discharge: 2018-01-20 | Payer: MEDICAID | Primary: Adolescent Medicine

## 2018-01-20 ENCOUNTER — Ambulatory Visit: Admit: 2018-01-20 | Discharge: 2018-01-21 | Payer: MEDICAID | Primary: Adolescent Medicine

## 2018-01-20 DIAGNOSIS — Z789 Other specified health status: ICD-10-CM

## 2018-01-20 DIAGNOSIS — J339 Nasal polyp, unspecified: ICD-10-CM

## 2018-01-20 DIAGNOSIS — J310 Chronic rhinitis: ICD-10-CM

## 2018-01-20 DIAGNOSIS — J45909 Unspecified asthma, uncomplicated: Principal | ICD-10-CM

## 2018-01-20 DIAGNOSIS — J329 Chronic sinusitis, unspecified: ICD-10-CM

## 2018-01-20 DIAGNOSIS — F419 Anxiety disorder, unspecified: ICD-10-CM

## 2018-01-20 DIAGNOSIS — K219 Gastro-esophageal reflux disease without esophagitis: ICD-10-CM

## 2018-01-21 DIAGNOSIS — J339 Nasal polyp, unspecified: Principal | ICD-10-CM

## 2018-01-26 ENCOUNTER — Encounter: Admit: 2018-01-26 | Discharge: 2018-01-26 | Payer: MEDICAID | Primary: Adolescent Medicine

## 2018-01-26 DIAGNOSIS — J45909 Unspecified asthma, uncomplicated: Principal | ICD-10-CM

## 2018-01-26 DIAGNOSIS — J339 Nasal polyp, unspecified: ICD-10-CM

## 2018-01-26 DIAGNOSIS — F419 Anxiety disorder, unspecified: ICD-10-CM

## 2018-01-26 DIAGNOSIS — Z789 Other specified health status: ICD-10-CM

## 2018-01-26 DIAGNOSIS — J329 Chronic sinusitis, unspecified: ICD-10-CM

## 2018-01-26 DIAGNOSIS — K219 Gastro-esophageal reflux disease without esophagitis: ICD-10-CM

## 2018-01-26 DIAGNOSIS — J310 Chronic rhinitis: ICD-10-CM

## 2018-03-27 ENCOUNTER — Ambulatory Visit: Admit: 2018-03-27 | Discharge: 2018-03-28 | Primary: Adolescent Medicine

## 2018-03-27 ENCOUNTER — Encounter: Admit: 2018-03-27 | Discharge: 2018-03-27 | Payer: MEDICAID | Primary: Adolescent Medicine

## 2018-03-27 DIAGNOSIS — J45909 Unspecified asthma, uncomplicated: Principal | ICD-10-CM

## 2018-03-27 DIAGNOSIS — Z789 Other specified health status: ICD-10-CM

## 2018-03-27 DIAGNOSIS — J324 Chronic pansinusitis: Secondary | ICD-10-CM

## 2018-03-27 DIAGNOSIS — J310 Chronic rhinitis: ICD-10-CM

## 2018-03-27 DIAGNOSIS — F419 Anxiety disorder, unspecified: ICD-10-CM

## 2018-03-27 DIAGNOSIS — J329 Chronic sinusitis, unspecified: ICD-10-CM

## 2018-03-27 DIAGNOSIS — K219 Gastro-esophageal reflux disease without esophagitis: ICD-10-CM

## 2018-03-27 DIAGNOSIS — J339 Nasal polyp, unspecified: ICD-10-CM

## 2018-03-28 ENCOUNTER — Encounter: Admit: 2018-03-28 | Discharge: 2018-03-28 | Payer: MEDICAID | Primary: Adolescent Medicine

## 2018-03-28 DIAGNOSIS — J339 Nasal polyp, unspecified: Principal | ICD-10-CM

## 2018-03-29 ENCOUNTER — Encounter: Admit: 2018-03-29 | Discharge: 2018-03-29 | Payer: MEDICAID | Primary: Adolescent Medicine

## 2018-06-13 ENCOUNTER — Encounter: Admit: 2018-06-13 | Discharge: 2018-06-13 | Payer: MEDICAID | Primary: Adolescent Medicine

## 2018-06-13 ENCOUNTER — Ambulatory Visit: Admit: 2018-06-13 | Discharge: 2018-06-14 | Primary: Adolescent Medicine

## 2018-06-13 DIAGNOSIS — J45909 Unspecified asthma, uncomplicated: Principal | ICD-10-CM

## 2018-06-13 DIAGNOSIS — J310 Chronic rhinitis: ICD-10-CM

## 2018-06-13 DIAGNOSIS — F419 Anxiety disorder, unspecified: ICD-10-CM

## 2018-06-13 DIAGNOSIS — Z789 Other specified health status: ICD-10-CM

## 2018-06-13 DIAGNOSIS — J339 Nasal polyp, unspecified: ICD-10-CM

## 2018-06-13 DIAGNOSIS — J329 Chronic sinusitis, unspecified: ICD-10-CM

## 2018-06-13 DIAGNOSIS — K219 Gastro-esophageal reflux disease without esophagitis: ICD-10-CM

## 2018-06-13 NOTE — Telephone Encounter
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 There were some questions he and his PCP had regarding his Dupixent and eosinophils relating to this disorder.    Patient has been scheduled for 2:30 today for a follow up with Dr. Bebe Shaggy for follow up. Requesting records from PCP.

## 2018-06-14 DIAGNOSIS — L2084 Intrinsic (allergic) eczema: ICD-10-CM

## 2018-06-14 DIAGNOSIS — T781XXS Other adverse food reactions, not elsewhere classified, sequela: Principal | ICD-10-CM

## 2018-06-14 DIAGNOSIS — J339 Nasal polyp, unspecified: ICD-10-CM

## 2018-06-14 DIAGNOSIS — J4520 Mild intermittent asthma, uncomplicated: ICD-10-CM

## 2018-06-15 ENCOUNTER — Encounter: Admit: 2018-06-15 | Discharge: 2018-06-15 | Payer: MEDICAID | Primary: Adolescent Medicine

## 2018-06-16 ENCOUNTER — Encounter: Admit: 2018-06-16 | Discharge: 2018-06-16 | Payer: MEDICAID | Primary: Adolescent Medicine

## 2018-06-16 DIAGNOSIS — J339 Nasal polyp, unspecified: ICD-10-CM

## 2018-06-16 DIAGNOSIS — K219 Gastro-esophageal reflux disease without esophagitis: ICD-10-CM

## 2018-06-16 DIAGNOSIS — J329 Chronic sinusitis, unspecified: ICD-10-CM

## 2018-06-16 DIAGNOSIS — J45909 Unspecified asthma, uncomplicated: Principal | ICD-10-CM

## 2018-06-16 DIAGNOSIS — Z789 Other specified health status: ICD-10-CM

## 2018-06-16 DIAGNOSIS — J310 Chronic rhinitis: ICD-10-CM

## 2018-06-16 DIAGNOSIS — F419 Anxiety disorder, unspecified: ICD-10-CM

## 2018-06-20 ENCOUNTER — Encounter: Admit: 2018-06-20 | Discharge: 2018-06-20 | Payer: MEDICAID | Primary: Adolescent Medicine

## 2018-06-20 NOTE — Telephone Encounter
Needing records from PCP. Contacted office and LVM to fax records to [place]. Will continue ot follow.

## 2018-06-21 ENCOUNTER — Encounter: Admit: 2018-06-21 | Discharge: 2018-06-21 | Payer: MEDICAID | Primary: Adolescent Medicine

## 2018-06-21 NOTE — Progress Notes
Reviewed outside records from PCP Dr. Kristine Linea- scanned into EMR and pertinent findings were noted below:    Labs  CBC April 25, 2018  WBC 11.8, hemoglobin 12.8, hematocrit 38.3, platelets 303  Eosinophil 3.2 absolute  AST 50, ALT 94 (high)  C-ANCA less than 1: 20  P ANCA less than 1: 20    Office visit May 30, 2018  Patient was seen urgently for rapid onset of depression with irritability, panic, and feeling hopeless.  He admits that he has been very stressed since seen that he may have shock stress syndrome which may be the cause of his intermittent blood in urine, nosebleeds, abdominal pain, fatigue, nasal polyposis, and other symptoms.  He is more distressed now due to the fear that his nasal polyposis disorder may not be curable.  April 25, 2018 absolute eosinophil count 3.2  May 26, 2025 absolute eosinophil count 4.3  As per the notes, the elevations in eosinophil count occur immediately after the Dupixent injection for degenerative nasal polyposis.  Increase eosinophil is a possible side effect of this medication.  Elevations in the eosinophil count also correspond to his episodes of blood in urine, nosebleeds, and other symptoms are less compatible Churg-Strauss syndrome.    We discussed treatment options and agreed for 5 days of 50 mg prednisone daily, and 50 mg Benadryl daily for 6 days prior to and 6 days following his Dupixent injection.  He agreed to schedule follow-up with his immunologist.  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 antibody screen negative class I antibody screen negative, class II antibody screen negative  ESR 5, CRP 5, histamine 0.36 (reference range less than 1)    Marvia Pickles, MD

## 2018-06-22 ENCOUNTER — Encounter: Admit: 2018-06-22 | Discharge: 2018-06-22 | Payer: MEDICAID | Primary: Adolescent Medicine

## 2018-06-22 DIAGNOSIS — D721 Eosinophilia: Principal | ICD-10-CM

## 2018-06-22 DIAGNOSIS — M301 Polyarteritis with lung involvement [Churg-Strauss]: ICD-10-CM

## 2018-06-28 ENCOUNTER — Encounter: Admit: 2018-06-28 | Discharge: 2018-06-28 | Payer: MEDICAID | Primary: Adolescent Medicine

## 2018-07-12 ENCOUNTER — Encounter: Admit: 2018-07-12 | Discharge: 2018-07-12 | Payer: MEDICAID | Primary: Adolescent Medicine

## 2018-09-25 ENCOUNTER — Ambulatory Visit: Admit: 2018-09-25 | Discharge: 2018-09-26 | Primary: Adolescent Medicine

## 2018-09-25 ENCOUNTER — Encounter: Admit: 2018-09-25 | Discharge: 2018-09-25 | Primary: Adolescent Medicine

## 2018-09-25 DIAGNOSIS — J343 Hypertrophy of nasal turbinates: Secondary | ICD-10-CM

## 2018-09-25 DIAGNOSIS — J339 Nasal polyp, unspecified: Secondary | ICD-10-CM

## 2018-09-25 DIAGNOSIS — J324 Chronic pansinusitis: Principal | ICD-10-CM

## 2018-10-04 ENCOUNTER — Encounter: Admit: 2018-10-04 | Discharge: 2018-10-04 | Primary: Adolescent Medicine

## 2018-10-04 DIAGNOSIS — J339 Nasal polyp, unspecified: Secondary | ICD-10-CM

## 2018-10-04 DIAGNOSIS — L2084 Intrinsic (allergic) eczema: Secondary | ICD-10-CM

## 2018-10-04 DIAGNOSIS — J310 Chronic rhinitis: Secondary | ICD-10-CM

## 2018-10-04 DIAGNOSIS — K219 Gastro-esophageal reflux disease without esophagitis: Secondary | ICD-10-CM

## 2018-10-04 DIAGNOSIS — J4520 Mild intermittent asthma, uncomplicated: Secondary | ICD-10-CM

## 2018-10-04 DIAGNOSIS — Z789 Other specified health status: Secondary | ICD-10-CM

## 2018-10-04 DIAGNOSIS — F419 Anxiety disorder, unspecified: Secondary | ICD-10-CM

## 2018-10-04 DIAGNOSIS — T781XXS Other adverse food reactions, not elsewhere classified, sequela: Principal | ICD-10-CM

## 2018-10-04 DIAGNOSIS — J329 Chronic sinusitis, unspecified: Secondary | ICD-10-CM

## 2018-10-04 DIAGNOSIS — J45909 Unspecified asthma, uncomplicated: Secondary | ICD-10-CM

## 2018-10-04 DIAGNOSIS — R58 Hemorrhage, not elsewhere classified: Secondary | ICD-10-CM

## 2018-10-04 MED ORDER — LEVOCETIRIZINE 5 MG PO TAB
5 mg | ORAL_TABLET | Freq: Every day | ORAL | 3 refills | 90.00000 days | Status: AC
Start: 2018-10-04 — End: ?

## 2018-10-04 MED ORDER — HYDROCORTISONE 2.5 % TP OINT
Freq: Two times a day (BID) | TOPICAL | 0 refills | 50.00000 days | Status: DC
Start: 2018-10-04 — End: 2019-01-29

## 2018-10-04 MED ORDER — AUVI-Q 0.3 MG/0.3 ML IJ ATIN
ORAL | 0 refills | 1.00000 days | Status: DC
Start: 2018-10-04 — End: 2019-01-29

## 2018-10-04 NOTE — Patient Instructions
Start Xyzal 5mg  daily or zyrtec 10mg  daily. Avoid benadryl.

## 2018-10-05 ENCOUNTER — Encounter: Admit: 2018-10-05 | Discharge: 2018-10-05 | Primary: Adolescent Medicine

## 2018-10-05 ENCOUNTER — Ambulatory Visit: Admit: 2018-10-04 | Discharge: 2018-10-05 | Primary: Adolescent Medicine

## 2018-10-05 DIAGNOSIS — J45909 Unspecified asthma, uncomplicated: Secondary | ICD-10-CM

## 2018-10-05 DIAGNOSIS — K219 Gastro-esophageal reflux disease without esophagitis: Secondary | ICD-10-CM

## 2018-10-05 DIAGNOSIS — T50905D Adverse effect of unspecified drugs, medicaments and biological substances, subsequent encounter: Secondary | ICD-10-CM

## 2018-10-05 DIAGNOSIS — J329 Chronic sinusitis, unspecified: Secondary | ICD-10-CM

## 2018-10-05 DIAGNOSIS — J310 Chronic rhinitis: Secondary | ICD-10-CM

## 2018-10-05 DIAGNOSIS — F419 Anxiety disorder, unspecified: Secondary | ICD-10-CM

## 2018-10-05 DIAGNOSIS — J339 Nasal polyp, unspecified: Secondary | ICD-10-CM

## 2018-10-05 DIAGNOSIS — Z789 Other specified health status: Secondary | ICD-10-CM

## 2018-10-06 ENCOUNTER — Encounter: Admit: 2018-10-06 | Discharge: 2018-10-06 | Primary: Adolescent Medicine

## 2018-10-06 DIAGNOSIS — Z789 Other specified health status: Secondary | ICD-10-CM

## 2018-10-06 DIAGNOSIS — K219 Gastro-esophageal reflux disease without esophagitis: Secondary | ICD-10-CM

## 2018-10-06 DIAGNOSIS — J329 Chronic sinusitis, unspecified: Secondary | ICD-10-CM

## 2018-10-06 DIAGNOSIS — J45909 Unspecified asthma, uncomplicated: Secondary | ICD-10-CM

## 2018-10-06 DIAGNOSIS — J310 Chronic rhinitis: Secondary | ICD-10-CM

## 2018-10-06 DIAGNOSIS — J339 Nasal polyp, unspecified: Secondary | ICD-10-CM

## 2018-10-06 DIAGNOSIS — F419 Anxiety disorder, unspecified: Secondary | ICD-10-CM

## 2018-10-07 ENCOUNTER — Encounter: Admit: 2018-10-07 | Discharge: 2018-10-07 | Primary: Adolescent Medicine

## 2018-10-07 DIAGNOSIS — Z789 Other specified health status: Secondary | ICD-10-CM

## 2018-10-07 DIAGNOSIS — J329 Chronic sinusitis, unspecified: Secondary | ICD-10-CM

## 2018-10-07 DIAGNOSIS — F419 Anxiety disorder, unspecified: Secondary | ICD-10-CM

## 2018-10-07 DIAGNOSIS — J339 Nasal polyp, unspecified: Secondary | ICD-10-CM

## 2018-10-07 DIAGNOSIS — J45909 Unspecified asthma, uncomplicated: Secondary | ICD-10-CM

## 2018-10-07 DIAGNOSIS — K219 Gastro-esophageal reflux disease without esophagitis: Secondary | ICD-10-CM

## 2018-10-07 DIAGNOSIS — J310 Chronic rhinitis: Secondary | ICD-10-CM

## 2018-10-07 NOTE — Progress Notes
Date of Service: 09/25/2018    Subjective:             Jonathon Simon is a 23 y.o. male.    History of Present Illness    Doing well  States congestion and mucous  Headache not too bad  Some recent sneezing  No orbital complaints  States using irrigations intermittently   Still on dupixent       Review of Systems   All other systems reviewed and are negative.        Objective:         ??? albuterol (PROAIR HFA, VENTOLIN HFA, OR PROVENTIL HFA) 90 mcg/actuation inhaler Inhale two puffs by mouth into the lungs every 4 hours as needed for Wheezing or Shortness of Breath. Shake well before use.   ??? alprazolam (XANAX PO) Take 0.25 mg by mouth.   ??? AMOXICILLIN PO Take  by mouth.   ??? AUVI-Q 0.3 mg/0.3 mL auto-injector Inject 0.3 mg (1 Pen) into thigh if needed for anaphylactic reaction. May repeat in 5-15 minutes if needed.   ??? AUVI-Q 0.3 mg/0.3 mL auto-injector Inject 0.3 mg (1 Pen) into thigh if needed for anaphylactic reaction. May repeat in 5-15 minutes if needed.   ??? budesonide(+) (RHINOCORT ALLERGY) 32 mcg/actuation nasal spray Apply 1 spray to each nostril as directed daily.   ??? cetirizine (ZYRTEC) 10 mg tablet Take 1 tablet by mouth every morning.   ??? ciprofloxacin(+) (CILOXIN) 0.3 % ophthalmic solution 2 drops to affected eye BID for 7 days when infected   ??? docosahexanoic acid/epa (FISH OIL PO) Take  by mouth.   ??? dupilumab(+) (DUPIXENT) 300 mg/2 mL injectable SYRINGE Inject 2 syringes under the skin once on day 1, then decrease to 1 syringe every 14 days starting on day 15.   ??? fluticasone (FLONASE) 50 mcg/actuation nasal spray Apply two sprays to each nostril as directed daily. Shake bottle gently before using.   ??? fluticasone (FLONASE) 50 mcg/actuation nasal spray Apply  to each nostril as directed daily. Shake bottle gently before using.   ??? homeopathic drugs (ARNICA ESSENCE PO) Take  by mouth.   ??? HYDROcodone/acetaminophen (NORCO) 5/325 mg tablet Take 1 tablet by mouth every 6 hours as needed for Pain ??? hydrocortisone 2.5 % topical ointment Apply  topically to affected area twice daily.   ??? levocetirizine 5 mg tab Take one tablet by mouth daily for 30 days.   ??? montelukast (SINGULAIR) 10 mg tablet Take one tablet by mouth at bedtime daily.   ??? montelukast (SINGULAIR) 10 mg tablet Take 1 tablet by mouth at bedtime daily.   ??? morphine IR (MS-IR) 15 mg tablet Take 15 mg by mouth every 4 hours as needed for Pain   ??? MULTIVITAMIN PO Take 1 tablet by mouth daily.   ??? naproxen (NAPROSYN) 500 mg tablet Take one tablet by mouth twice daily with meals. Take with food.   ??? ondansetron (ZOFRAN) 4 mg tablet Take one tablet by mouth every 8 hours as needed for Nausea.   ??? other medication 1 Dose. Comfrey Oil   ??? other medication 1 Dose. Phospitor   ??? other medication 1 Dose. Glutacor   ??? oxyCODONE (ROXICODONE, OXY-IR) 5 mg tablet Take 1 tablet by mouth every 6 hours as needed for Pain   ??? polyethylene glycol 3350 (MIRALAX) 17 g packet Take 17 g by mouth daily.   ??? prednisone (DELTASONE) 10 mg tablet Take three tablets by mouth daily.   ??? prednisone (  DELTASONE) 10 mg tablet Take 5 tabs daily for 4 days. Then 4 tabs for 4 days, then 3 tabs for 4 days, then 2 tabs for 4 days, then 1 tab for 4 days   ??? sinus rinse pack kit Apply 1 packet into nose as directed as Needed.     Vitals:    09/25/18 1317   BP: 113/74   BP Source: Arm, Left Upper   Patient Position: Sitting   Pulse: 92   Weight: 103 kg (227 lb)   Height: 182.9 cm (72)   PainSc: Seven     Body mass index is 30.79 kg/m???.     Physical Exam    General:  Well-developed, well-nourished   Inspection:  Normocephalic and atraumatic without mass or lesion  Palpation:  Facial skeleton intact without bony stepoffs  Facial Strength:  Facial motility symmetric and full bilaterally  Tympanic Membrane:  Normal bilaterally  External nose:  No scar or anatomic deformity  Internal Nose:  Septum intact with mild S-shaped septal deviation with spur.  + edema, polyp at middle meatus, + rhinorrhea.  Oral cavity, Lips, Teeth, and Gums:  Mucosa and teeth intact and viable, No lesions, masses or ulcers  Oropharynx: No erythema or exudate, no masses or ulcerations, non-obstructive tonsils  Larynx:  Normal voice, no stridor or stertor.    Neck, Trachea, Lymphatics:  Midline trachea without mass or lesion, no lymphadenopathy  Eyes: No nystagmus with equal extraocular motion bilaterally  Cranial nerves II-XII are intact    Nasal Endoscopy: Patient is not anesthetize or decongested due to the concern for aerosolizing droplets with the COVID 19 pandemic .  Rigid endoscopy is performed; the patient tolerated the procedure well.  There is a mild septal deviation with a spur.  There is bilateral inferior turbinate hypertrophy.  There is global boggy mucosal edema.  There has been prior sinus surgery.  There are grade 2 polyps filling the ethmoid and sphenoid sinuses.  I cannot view into the frontal sinuses due to polyps.  Otherwise the sinonasal anatomy is normal.  The maxillary sinuses do have polyps within them.  The bilateral osteomeatal complexes are partially obstructed and the sphenoethmoid recess and the face of sphenoid is edematous bilaterally.  There are no masses, no purulence.  The nasopharynx is normal.            Assessment and Plan:  Encounter Diagnoses   Name Primary?   ??? Chronic pansinusitis Yes   ??? Nasal polyps      Doing well on dupixent.  Hasnt had steroids for quite a while  Encouraged him to use irrigations with budesonide twice daily  Control allergy as much as possible  Offered steroid taper today but he doesn't feel bad enough  rtc 2-3 months for followup

## 2018-10-17 ENCOUNTER — Encounter: Admit: 2018-10-17 | Discharge: 2018-10-17 | Primary: Adolescent Medicine

## 2018-10-17 DIAGNOSIS — M301 Polyarteritis with lung involvement [Churg-Strauss]: Secondary | ICD-10-CM

## 2018-10-17 NOTE — Telephone Encounter
Submitted referral to Rheumatology at [facility] as they accept [facility]. Submitted on [employer].

## 2018-10-27 ENCOUNTER — Encounter: Admit: 2018-10-27 | Discharge: 2018-10-27 | Primary: Adolescent Medicine

## 2018-10-27 DIAGNOSIS — J339 Nasal polyp, unspecified: Secondary | ICD-10-CM

## 2018-10-27 MED ORDER — DUPILUMAB 300 MG/2 ML SC SYRG
SUBCUTANEOUS | 11 refills | 28.00000 days | Status: DC
Start: 2018-10-27 — End: 2019-01-29

## 2018-10-30 ENCOUNTER — Encounter: Admit: 2018-10-30 | Discharge: 2018-10-30 | Primary: Adolescent Medicine

## 2018-10-30 DIAGNOSIS — F419 Anxiety disorder, unspecified: Secondary | ICD-10-CM

## 2018-10-30 DIAGNOSIS — Z789 Other specified health status: Secondary | ICD-10-CM

## 2018-10-30 DIAGNOSIS — K219 Gastro-esophageal reflux disease without esophagitis: Secondary | ICD-10-CM

## 2018-10-30 DIAGNOSIS — J329 Chronic sinusitis, unspecified: Secondary | ICD-10-CM

## 2018-10-30 DIAGNOSIS — J339 Nasal polyp, unspecified: Secondary | ICD-10-CM

## 2018-10-30 DIAGNOSIS — J45909 Unspecified asthma, uncomplicated: Secondary | ICD-10-CM

## 2018-10-30 DIAGNOSIS — J310 Chronic rhinitis: Secondary | ICD-10-CM

## 2018-10-30 DIAGNOSIS — J324 Chronic pansinusitis: Principal | ICD-10-CM

## 2018-10-30 NOTE — Progress Notes
Date of Service: 10/30/2018    Subjective:             Jonathon Simon is a 23 y.o. male.    History of Present Illness  Doing well except L sided conjunctivitis and keratitis for 4 days thought to be due to Dupixent, waxing and waning. Usually gets Dupixent twice a month, but has been 3 weeks since last dose - delayed due to recent symptoms. He does get occasional arm and facial rashes as well, though not present at the moment. Delayed his dupixent dose due to keratitis and feels that sense of smell is slightly worse.He continues to have congestion, forehead pressure with occasional sharp headaches and yellow thick mucus drainage. Using budesonide irrigations daily; not using flonase due to prior epistaxis with use. He cannot recall the last time he had oral steroids.       Review of Systems   All other systems reviewed and are negative.    Objective:         ??? albuterol (PROAIR HFA, VENTOLIN HFA, OR PROVENTIL HFA) 90 mcg/actuation inhaler Inhale two puffs by mouth into the lungs every 4 hours as needed for Wheezing or Shortness of Breath. Shake well before use.   ??? alprazolam (XANAX PO) Take 0.25 mg by mouth.   ??? AMOXICILLIN PO Take  by mouth.   ??? AUVI-Q 0.3 mg/0.3 mL auto-injector Inject 0.3 mg (1 Pen) into thigh if needed for anaphylactic reaction. May repeat in 5-15 minutes if needed.   ??? AUVI-Q 0.3 mg/0.3 mL auto-injector Inject 0.3 mg (1 Pen) into thigh if needed for anaphylactic reaction. May repeat in 5-15 minutes if needed.   ??? budesonide(+) (RHINOCORT ALLERGY) 32 mcg/actuation nasal spray Apply 1 spray to each nostril as directed daily.   ??? cetirizine (ZYRTEC) 10 mg tablet Take 1 tablet by mouth every morning.   ??? ciprofloxacin(+) (CILOXIN) 0.3 % ophthalmic solution 2 drops to affected eye BID for 7 days when infected   ??? docosahexanoic acid/epa (FISH OIL PO) Take  by mouth.   ??? dupilumab (DUPIXENT) 300 mg/2 mL injectable SYRINGE Inject 2 syringes under the skin once on day 1, then decrease to 1 syringe every 14 days starting on day 15.   ??? fluticasone (FLONASE) 50 mcg/actuation nasal spray Apply two sprays to each nostril as directed daily. Shake bottle gently before using.   ??? fluticasone (FLONASE) 50 mcg/actuation nasal spray Apply  to each nostril as directed daily. Shake bottle gently before using.   ??? homeopathic drugs (ARNICA ESSENCE PO) Take  by mouth.   ??? HYDROcodone/acetaminophen (NORCO) 5/325 mg tablet Take 1 tablet by mouth every 6 hours as needed for Pain   ??? hydrocortisone 2.5 % topical ointment Apply  topically to affected area twice daily.   ??? levocetirizine 5 mg tab Take one tablet by mouth daily for 30 days.   ??? montelukast (SINGULAIR) 10 mg tablet Take one tablet by mouth at bedtime daily.   ??? montelukast (SINGULAIR) 10 mg tablet Take 1 tablet by mouth at bedtime daily.   ??? morphine IR (MS-IR) 15 mg tablet Take 15 mg by mouth every 4 hours as needed for Pain   ??? MULTIVITAMIN PO Take 1 tablet by mouth daily.   ??? naproxen (NAPROSYN) 500 mg tablet Take one tablet by mouth twice daily with meals. Take with food.   ??? ondansetron (ZOFRAN) 4 mg tablet Take one tablet by mouth every 8 hours as needed for Nausea.   ??? other  medication 1 Dose. Comfrey Oil   ??? other medication 1 Dose. Phospitor   ??? other medication 1 Dose. Glutacor   ??? oxyCODONE (ROXICODONE, OXY-IR) 5 mg tablet Take 1 tablet by mouth every 6 hours as needed for Pain   ??? polyethylene glycol 3350 (MIRALAX) 17 g packet Take 17 g by mouth daily.   ??? prednisone (DELTASONE) 10 mg tablet Take three tablets by mouth daily.   ??? prednisone (DELTASONE) 10 mg tablet Take 5 tabs daily for 4 days. Then 4 tabs for 4 days, then 3 tabs for 4 days, then 2 tabs for 4 days, then 1 tab for 4 days   ??? sinus rinse pack kit Apply 1 packet into nose as directed as Needed.     Vitals:    10/30/18 1312   BP: 114/76   BP Source: Arm, Right Upper   Patient Position: Sitting   Pulse: (!) 160 Temp: 36.7 ???C (98 ???F)   Weight: 104.3 kg (230 lb)   Height: 180.3 cm (71)   PainSc: Seven     Body mass index is 32.08 kg/m???.     Physical Exam  General:  Well-developed, well-nourished   Inspection:  Normocephalic and atraumatic without mass or lesion  Palpation:  Facial skeleton intact without bony stepoffs  Facial Strength:  Facial motility symmetric and full bilaterally  Tympanic Membrane:  Normal bilaterally  External nose:  No scar or anatomic deformity  Internal Nose:  Septum intact with mild S-shaped septal deviation with spur.  + edema, polyp at middle meatus on the left, + clear rhinorrhea bilaterally  Oral cavity, Lips, Teeth, and Gums:  Mucosa and teeth intact and viable, No lesions, masses or ulcers  Oropharynx: No erythema or exudate, no masses or ulcerations, non-obstructive tonsils  Larynx:  Normal voice, no stridor or stertor.    Neck, Trachea, Lymphatics:  Midline trachea without mass or lesion, no lymphadenopathy  Eyes: No nystagmus with equal extraocular motion bilaterally  Cranial nerves II-XII are intact    Nasal Endoscopy: Patient is anesthetized with 4% lidocaine and decongested with neosynephrine. Rigid endoscopy is performed; the patient tolerated the procedure well.  There is a mild septal deviation with a spur.  There is bilateral inferior turbinate hypertrophy.  There is global boggy mucosal edema bilaterally.  There has been prior sinus surgery. There are grade 3 polyps filling the middle meatus on the left side and unable to pass the scope to visualize maxillary, ethmoid or frontal sinuses due to edema and discomfort. There is clear rhinorrhea and edema on the right side with grade 2 polyps. Unable to visualize into the right middle meatus due to edema. There is no purulence.  The nasopharynx is normal.      Assessment and Plan:  Encounter Diagnoses   Name Primary?   ??? Chronic pansinusitis Yes   ??? Nasal polyps We discussed options including observation, course of oral steroids, and revision surgery.   We will proceed with oral steroids and have him return to clinic in 3 weeks for repeat evaluation. Will plan to discuss need for revision surgery at that time.

## 2018-10-31 ENCOUNTER — Ambulatory Visit: Admit: 2018-10-30 | Discharge: 2018-10-31 | Primary: Adolescent Medicine

## 2018-10-31 DIAGNOSIS — J339 Nasal polyp, unspecified: Secondary | ICD-10-CM

## 2018-11-01 ENCOUNTER — Encounter: Admit: 2018-11-01 | Discharge: 2018-11-01 | Primary: Adolescent Medicine

## 2018-11-01 DIAGNOSIS — Z789 Other specified health status: Secondary | ICD-10-CM

## 2018-11-01 DIAGNOSIS — K219 Gastro-esophageal reflux disease without esophagitis: Secondary | ICD-10-CM

## 2018-11-01 DIAGNOSIS — J329 Chronic sinusitis, unspecified: Secondary | ICD-10-CM

## 2018-11-01 DIAGNOSIS — J45909 Unspecified asthma, uncomplicated: Secondary | ICD-10-CM

## 2018-11-01 DIAGNOSIS — F419 Anxiety disorder, unspecified: Secondary | ICD-10-CM

## 2018-11-01 DIAGNOSIS — J310 Chronic rhinitis: Secondary | ICD-10-CM

## 2018-11-01 DIAGNOSIS — J339 Nasal polyp, unspecified: Secondary | ICD-10-CM

## 2018-11-10 ENCOUNTER — Encounter: Admit: 2018-11-10 | Discharge: 2018-11-10 | Primary: Adolescent Medicine

## 2018-11-20 ENCOUNTER — Encounter: Admit: 2018-11-20 | Discharge: 2018-11-20 | Primary: Adolescent Medicine

## 2018-11-20 ENCOUNTER — Ambulatory Visit: Admit: 2018-11-20 | Discharge: 2018-11-21 | Primary: Adolescent Medicine

## 2018-11-20 DIAGNOSIS — J310 Chronic rhinitis: Secondary | ICD-10-CM

## 2018-11-20 DIAGNOSIS — J329 Chronic sinusitis, unspecified: Secondary | ICD-10-CM

## 2018-11-20 DIAGNOSIS — J339 Nasal polyp, unspecified: Secondary | ICD-10-CM

## 2018-11-20 DIAGNOSIS — J45909 Unspecified asthma, uncomplicated: Secondary | ICD-10-CM

## 2018-11-20 DIAGNOSIS — K219 Gastro-esophageal reflux disease without esophagitis: Secondary | ICD-10-CM

## 2018-11-20 DIAGNOSIS — F419 Anxiety disorder, unspecified: Secondary | ICD-10-CM

## 2018-11-20 DIAGNOSIS — Z789 Other specified health status: Secondary | ICD-10-CM

## 2018-11-20 MED ORDER — BACITRACIN ZINC 500 UNIT/GRAM TP OINT
Freq: Two times a day (BID) | TOPICAL | 0 refills | Status: DC
Start: 2018-11-20 — End: 2019-02-16

## 2018-11-20 NOTE — Progress Notes
Date of Service: 11/20/2018    Subjective:             Jonathon Simon is a 23 y.o. male.    History of Present Illness    History of CRSwNP  Suspicious for EGPA  Having bone marrow biopsy soon at [facility]     States sinuses better after steroids  Still with some congestion  Still on dupixent       Review of Systems   Constitutional: Negative.    HENT: Negative.    Eyes: Negative.    Respiratory: Negative.    Cardiovascular: Negative.    Gastrointestinal: Negative.    Endocrine: Negative.    Genitourinary: Negative.    Musculoskeletal: Negative.    Skin: Negative.    Allergic/Immunologic: Negative.    Neurological: Positive for headaches.   Hematological: Negative.    Psychiatric/Behavioral: Negative.          Objective:         ??? albuterol (PROAIR HFA, VENTOLIN HFA, OR PROVENTIL HFA) 90 mcg/actuation inhaler Inhale two puffs by mouth into the lungs every 4 hours as needed for Wheezing or Shortness of Breath. Shake well before use.   ??? alprazolam (XANAX PO) Take 0.25 mg by mouth.   ??? AMOXICILLIN PO Take  by mouth.   ??? AUVI-Q 0.3 mg/0.3 mL auto-injector Inject 0.3 mg (1 Pen) into thigh if needed for anaphylactic reaction. May repeat in 5-15 minutes if needed.   ??? AUVI-Q 0.3 mg/0.3 mL auto-injector Inject 0.3 mg (1 Pen) into thigh if needed for anaphylactic reaction. May repeat in 5-15 minutes if needed.   ??? budesonide(+) (RHINOCORT ALLERGY) 32 mcg/actuation nasal spray Apply 1 spray to each nostril as directed daily.   ??? cetirizine (ZYRTEC) 10 mg tablet Take 1 tablet by mouth every morning.   ??? ciprofloxacin(+) (CILOXIN) 0.3 % ophthalmic solution 2 drops to affected eye BID for 7 days when infected   ??? docosahexanoic acid/epa (FISH OIL PO) Take  by mouth.   ??? dupilumab (DUPIXENT) 300 mg/2 mL injectable SYRINGE Inject 2 syringes under the skin once on day 1, then decrease to 1 syringe every 14 days starting on day 15.   ??? fluticasone (FLONASE) 50 mcg/actuation nasal spray Apply two sprays to each nostril as directed daily. Shake bottle gently before using.   ??? fluticasone (FLONASE) 50 mcg/actuation nasal spray Apply  to each nostril as directed daily. Shake bottle gently before using.   ??? homeopathic drugs (ARNICA ESSENCE PO) Take  by mouth.   ??? HYDROcodone/acetaminophen (NORCO) 5/325 mg tablet Take 1 tablet by mouth every 6 hours as needed for Pain   ??? hydrocortisone 2.5 % topical ointment Apply  topically to affected area twice daily.   ??? montelukast (SINGULAIR) 10 mg tablet Take one tablet by mouth at bedtime daily.   ??? montelukast (SINGULAIR) 10 mg tablet Take 1 tablet by mouth at bedtime daily.   ??? morphine IR (MS-IR) 15 mg tablet Take 15 mg by mouth every 4 hours as needed for Pain   ??? MULTIVITAMIN PO Take 1 tablet by mouth daily.   ??? naproxen (NAPROSYN) 500 mg tablet Take one tablet by mouth twice daily with meals. Take with food.   ??? ondansetron (ZOFRAN) 4 mg tablet Take one tablet by mouth every 8 hours as needed for Nausea.   ??? other medication 1 Dose. Comfrey Oil   ??? other medication 1 Dose. Phospitor   ??? other medication 1 Dose. Glutacor   ???  oxyCODONE (ROXICODONE, OXY-IR) 5 mg tablet Take 1 tablet by mouth every 6 hours as needed for Pain   ??? polyethylene glycol 3350 (MIRALAX) 17 g packet Take 17 g by mouth daily.   ??? prednisone (DELTASONE) 10 mg tablet Take three tablets by mouth daily.   ??? prednisone (DELTASONE) 10 mg tablet Take 5 tabs daily for 4 days. Then 4 tabs for 4 days, then 3 tabs for 4 days, then 2 tabs for 4 days, then 1 tab for 4 days   ??? sinus rinse pack kit Apply 1 packet into nose as directed as Needed.     Vitals:    11/20/18 1312   BP: 108/75   Pulse: 85   Temp: 36.6 ???C (97.9 ???F)   Weight: 104.3 kg (230 lb)   Height: 182.9 cm (72)   PainSc: Seven     Body mass index is 31.19 kg/m???.     Physical Exam    General:  Well-developed, well-nourished   Inspection:  Normocephalic and atraumatic without mass or lesion  Palpation:  Facial skeleton intact without bony stepoffs Facial Strength:  Facial motility symmetric and full bilaterally  Tympanic Membrane:  Normal bilaterally  External nose:  No scar or anatomic deformity  Internal Nose:  Septum intact with mild S-shaped septal deviation with spur.  No edema, polyp, or rhinorrhea.  Oral cavity, Lips, Teeth, and Gums:  Mucosa and teeth intact and viable, No lesions, masses or ulcers  Oropharynx: No erythema or exudate, no masses or ulcerations, non-obstructive tonsils  Larynx:  Normal voice, no stridor or stertor.    Neck, Trachea, Lymphatics:  Midline trachea without mass or lesion, no lymphadenopathy  Eyes: No nystagmus with equal extraocular motion bilaterally  Cranial nerves I-XII are intact    Nasal Endoscopy: Patient is anesthetized with 4% lidocaine and decongested with neosynephrine.  Rigid endoscopy is performed; the patient tolerated the procedure well.  There is a mild septal deviation.  There is global boggy mucosal edema.  There is evidence of prior sinus surgery.  All sinuses appear open.  There are grade 2-3 obstructive polyps within the bilateral ethmoid cavities.  The bilateral osteomeatal complexes are unobstructed and the face of sphenoid is edematous bilaterally.  There are no masses, no purulence.  The nasopharynx is normal.       Assessment and Plan:  Encounter Diagnoses   Name Primary?   ??? Chronic pansinusitis Yes   ??? Nasal polyps      Continue irrigations  Continue pulmicort in irrigation  Continue dupixent  Follow up BM biopsy  RTC 2-3 months

## 2018-11-21 DIAGNOSIS — J324 Chronic pansinusitis: Principal | ICD-10-CM

## 2018-12-03 ENCOUNTER — Encounter: Admit: 2018-12-03 | Discharge: 2018-12-03 | Primary: Adolescent Medicine

## 2018-12-03 DIAGNOSIS — K219 Gastro-esophageal reflux disease without esophagitis: Secondary | ICD-10-CM

## 2018-12-03 DIAGNOSIS — F419 Anxiety disorder, unspecified: Secondary | ICD-10-CM

## 2018-12-03 DIAGNOSIS — J310 Chronic rhinitis: Secondary | ICD-10-CM

## 2018-12-03 DIAGNOSIS — J329 Chronic sinusitis, unspecified: Secondary | ICD-10-CM

## 2018-12-03 DIAGNOSIS — Z789 Other specified health status: Secondary | ICD-10-CM

## 2018-12-03 DIAGNOSIS — J339 Nasal polyp, unspecified: Secondary | ICD-10-CM

## 2018-12-03 DIAGNOSIS — J45909 Unspecified asthma, uncomplicated: Secondary | ICD-10-CM

## 2019-01-25 LAB — URINALYSIS MICROSCOPIC REFLEX TO CULTURE

## 2019-01-25 LAB — URINALYSIS DIPSTICK REFLEX TO CULTURE
Lab: NEGATIVE 10*3/uL — ABNORMAL HIGH (ref 3–12)
Lab: NEGATIVE MMOL/L — ABNORMAL LOW (ref 21–30)
Lab: NEGATIVE U/L (ref 7–56)
Lab: NEGATIVE g/dL (ref 3.5–5.0)
Lab: NEGATIVE g/dL (ref 6.0–8.0)

## 2019-01-25 LAB — POC TROPONIN: Lab: 0 ng/mL (ref 0.00–0.05)

## 2019-01-25 LAB — CBC AND DIFF
Lab: 0 10*3/uL (ref 0–0.20)
Lab: 17 10*3/uL — ABNORMAL HIGH (ref 4.5–11.0)
Lab: 3.7 10*3/uL — ABNORMAL HIGH (ref 0–0.45)

## 2019-01-25 LAB — POC LACTATE: Lab: 1 MMOL/L (ref 0.5–2.0)

## 2019-01-25 LAB — LIPASE: Lab: 18 U/L — ABNORMAL LOW (ref 11–82)

## 2019-01-25 LAB — COMPREHENSIVE METABOLIC PANEL
Lab: 140 MMOL/L (ref 137–147)
Lab: >60 mL/min (ref >60)
Lab: >60 mL/min — ABNORMAL HIGH (ref >60)

## 2019-01-25 MED ORDER — LORAZEPAM 2 MG/ML IJ SOLN
.5 mg | Freq: Once | INTRAVENOUS | 0 refills | Status: CP
Start: 2019-01-25 — End: ?
  Administered 2019-01-25: 20:00:00 0.5 mg via INTRAVENOUS

## 2019-01-25 MED ORDER — ALBUTEROL SULFATE 90 MCG/ACTUATION IN HFAA
2 | RESPIRATORY_TRACT | 0 refills | Status: DC | PRN
Start: 2019-01-25 — End: 2019-01-26

## 2019-01-25 MED ORDER — PROCHLORPERAZINE EDISYLATE 5 MG/ML IJ SOLN
10 mg | Freq: Once | INTRAVENOUS | 0 refills | Status: CP
Start: 2019-01-25 — End: ?
  Administered 2019-01-26: 04:00:00 10 mg via INTRAVENOUS

## 2019-01-25 MED ORDER — ALBUTEROL SULFATE 90 MCG/ACTUATION IN HFAA
2 | RESPIRATORY_TRACT | 0 refills | Status: DC | PRN
Start: 2019-01-25 — End: 2019-02-07

## 2019-01-25 MED ORDER — POLYETHYLENE GLYCOL 3350 17 GRAM PO PWPK
1 | Freq: Two times a day (BID) | ORAL | 0 refills | Status: DC
Start: 2019-01-25 — End: 2019-02-07
  Administered 2019-01-26 – 2019-02-06 (×22): 17 g via ORAL

## 2019-01-25 MED ORDER — FLUTICASONE PROPIONATE 50 MCG/ACTUATION NA SPSN
2 | Freq: Every day | NASAL | 0 refills | Status: DC
Start: 2019-01-25 — End: 2019-02-07
  Administered 2019-01-26: 15:00:00 2 via NASAL

## 2019-01-25 MED ORDER — ACETAMINOPHEN/LIDOCAINE/ANTACID DS(#) 1:1:3  PO SUSP
30 mL | ORAL | 0 refills | Status: DC | PRN
Start: 2019-01-25 — End: 2019-01-26

## 2019-01-25 MED ORDER — HYDROCODONE-ACETAMINOPHEN 5-325 MG PO TAB
1-2 | ORAL | 0 refills | Status: DC | PRN
Start: 2019-01-25 — End: 2019-01-26
  Administered 2019-01-26: 02:00:00 2 via ORAL

## 2019-01-25 MED ORDER — LACTATED RINGERS IV SOLP
1000 mL | INTRAVENOUS | 0 refills | Status: CP
Start: 2019-01-25 — End: ?
  Administered 2019-01-25: 20:00:00 1000 mL via INTRAVENOUS

## 2019-01-25 MED ORDER — SODIUM CHLORIDE 0.9 % IV SOLP
INTRAVENOUS | 0 refills | Status: AC
Start: 2019-01-25 — End: ?
  Administered 2019-01-26 – 2019-01-29 (×13): 1000.000 mL via INTRAVENOUS

## 2019-01-25 MED ORDER — CETIRIZINE 10 MG PO TAB
10 mg | Freq: Every morning | ORAL | 0 refills | Status: DC
Start: 2019-01-25 — End: 2019-02-07
  Administered 2019-01-26 – 2019-02-06 (×12): 10 mg via ORAL

## 2019-01-25 MED ORDER — BISACODYL 5 MG PO TBEC
10 mg | Freq: Once | ORAL | 0 refills | Status: DC
Start: 2019-01-25 — End: 2019-01-27

## 2019-01-25 MED ORDER — CALCIUM CARBONATE 200 MG CALCIUM (500 MG) PO CHEW
500 mg | ORAL | 0 refills | Status: DC | PRN
Start: 2019-01-25 — End: 2019-02-07
  Administered 2019-01-26 – 2019-02-06 (×15): 500 mg via ORAL

## 2019-01-25 MED ORDER — MORPHINE 2 MG/ML IV SYRG
4 mg | INTRAVENOUS | 0 refills | Status: CP | PRN
Start: 2019-01-25 — End: ?
  Administered 2019-01-25 (×2): 4 mg via INTRAVENOUS

## 2019-01-25 MED ORDER — SENNOSIDES-DOCUSATE SODIUM 8.6-50 MG PO TAB
2 | Freq: Two times a day (BID) | ORAL | 0 refills | Status: DC
Start: 2019-01-25 — End: 2019-02-07
  Administered 2019-01-26 – 2019-02-06 (×20): 2 via ORAL

## 2019-01-25 MED ORDER — ONDANSETRON HCL (PF) 4 MG/2 ML IJ SOLN
4 mg | INTRAVENOUS | 0 refills | Status: DC | PRN
Start: 2019-01-25 — End: 2019-02-07
  Administered 2019-01-26 – 2019-02-06 (×21): 4 mg via INTRAVENOUS

## 2019-01-25 MED ORDER — MORPHINE 4 MG/ML IV SYRG
2-4 mg | INTRAVENOUS | 0 refills | Status: DC | PRN
Start: 2019-01-25 — End: 2019-01-30
  Administered 2019-01-26 – 2019-01-30 (×24): 4 mg via INTRAVENOUS

## 2019-01-25 MED ORDER — ONDANSETRON HCL 4 MG PO TAB
4 mg | ORAL | 0 refills | Status: DC | PRN
Start: 2019-01-25 — End: 2019-02-07
  Administered 2019-02-06: 02:00:00 4 mg via ORAL

## 2019-01-25 NOTE — ED Notes
This RN assumed care of patient. Patients father at the nurses station requesting pain medications for patient. Upon entering the room, patient is leaned over cart moaning. Patient requesting pain medication for 10/10 pain. Dr. Juanita Craver notified.

## 2019-01-25 NOTE — ED Notes
Received report from Jaysha, RN.

## 2019-01-25 NOTE — ED Notes
Pt presents to ED09 with many complaints including lower abdominal pain, diarrhea/constipation, flank pain, headache, double vision, sinus pressure, N/V, chest pain, and chills. Pt states that these symptoms all started 6 days ago and have increasingly gotten worse. 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 Pt states that the only treatment he has received has been immunotherapy as "none of the doctors know what to do with me." Pt is concerned that his cancer has spread to his abdomen and states "The doctor told me back then I was going to have a lot of problems with my GI tract later on." Pt denies being around any sick people recently and denies SOB, cough, loss of taste/smell. Monitors applied and VSS on RA and pt afebrile. Bed in the lowest locked position and call light within reach.

## 2019-01-25 NOTE — ED Notes
Pt resting in bed, no needs at this time.

## 2019-01-25 NOTE — ED Provider Notes
Jonathon Simon is a 23 y.o. male.    Chief Complaint:  Chief Complaint   Patient presents with   ? Generalized Body Aches     Abdominal pain, flank paink, headache, fatigue, N/V, chills. 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  Of note, the patient was seen at [facility] earlier this morning/late last night, at which time the patient was evaluated for the same complaints as today.  There, labs, Hemoccult, CT abdomen pelvis were conducted and the patient was given IV fluids and Zofran.  Hemoccult was negative.  Labs were remarkable for eosinophilia as well as elevated WBC, otherwise unremarkable.  CT abdomen with air-fluid levels in nondilated loops of large and small bowel representing malabsorption or enteritis.  CXR without abnormality.  CBC remarkable for granulocytosis, eosinophilia, teardrop and target cells seen on manual differential.  EKG was non-concerning.  The patient was discharged home to follow-up with his specialist at [facility] and PCP.  However, patient states that he has been to multiple emergency departments for similar complaints in the past week, without any satisfying work-up.  He states that he has not gotten better, and since gotten worse.  Father is also at bedside, stating that his previous son died in front of him, and thinks that his son right now is very sick and needs evaluation and further work-up.  Patient at bedside is tearful, stating he is in marked abdominal pain, and feeling like something is very wrong.  He denies any fever, though does note some chills.  States he has had shortness of breath as well as some cough, though has had a negative COVID-19 swab yesterday.  Also notes abdominal pain with mucousy loose stools with intermittent bright red blood present.  He also endorses some nausea and vomiting, though is unable to say exactly when and how many episodes.  Patient states that all of the symptoms presented approximately 7 days ago, and prior to that he was fine.          Review of Systems:  Review of Systems   Constitutional: Positive for chills and fatigue. Negative for fever.   HENT: Positive for facial swelling, rhinorrhea, sinus pressure and sinus pain. Negative for nosebleeds and sore throat.    Eyes: Negative for pain and visual disturbance.   Respiratory: Positive for cough and shortness of breath.    Cardiovascular: Positive for chest pain. Negative for leg swelling.   Gastrointestinal: Positive for abdominal pain, anal bleeding, diarrhea, nausea and vomiting.   Genitourinary: Positive for flank pain. Negative for dysuria, hematuria and urgency.   Musculoskeletal: Negative for back pain and neck pain.   Skin: Negative for rash.   Neurological: Positive for light-headedness and headaches.   All other systems reviewed and are negative.      Allergies:  Iodine; Peanut; and Tylenol [acetaminophen]    Past Medical History:  Medical History:   Diagnosis Date   ? Anxiety    ? Asthma    ? GERD (gastroesophageal reflux disease)    ? Nasal polyps    ? No  blood products     Pt is Jehov[employer]'s Witness, does not want any blood products.    ? Rhinitis    ? Sinusitis        Past Surgical History:  Surgical History:   Procedure Laterality Date   ? NASAL ENDOSCOPY WITH TOTAL ETHMOIDECTOMY AND SPHENOIDOTOMY WITH TISSUE REMOVAL Bilateral 10/29/2016    Performed by Lamar Benes, MD at [facility]   ? NASAL ENDOSCOPY WITH MAXILLARY ANTROSTOMY WITH TISSUE MAXILLARY REMOVAL Bilateral 10/29/2016 Performed by Lamar Benes, MD at [facility]   ? NASAL ENDOSCOPY WITH FRONTAL SINUSOTOMY Bilateral 10/29/2016    Performed by Lamar Benes, MD at [facility]   ? FUNCTIONAL ENDOSCOPY SINUS SURGERY IMAGE-GUIDED Bilateral 10/29/2016    Performed by Lamar Benes, MD at [facility]   ? SUBMUCOUS RESECTION TURBINATE Bilateral 10/29/2016    Performed by Lamar Benes, MD at [facility]   ? BIOPSY      biopsy on pt's penis     Social History:  Social History     Tobacco Use   ? Smoking status: Never Smoker   ? Smokeless tobacco: Never Used   Substance Use Topics   ? Alcohol use: No     Comment: pt states I would drink on occasion in the past.   ? Drug use: No     Social History     Substance and Sexual Activity   Drug Use No       Family History:  History reviewed. No pertinent family history.    Vitals:  ED Vitals    Date and Time T BP P RR SPO2P SPO2 User   01/25/19 1812 -- -- 73 16 PER MINUTE 73 97 % MB   01/25/19 1602 -- -- 68 15 PER MINUTE 74 96 % MB   01/25/19 1531 -- 136/83 65 17 PER MINUTE 65 97 % MB   01/25/19 1401 -- 115/97 86 14 PER MINUTE 94 -- MS   01/25/19 1200 -- 110/54 78 21 PER MINUTE 78 98 % JL   01/25/19 1146 -- 107/75 64 12 PER MINUTE 66 100 % JL   01/25/19 1131 36.9 ?C (98.5 ?F) 127/64 -- 20 PER MINUTE 63 98 % LP          Physical Exam:  Physical Exam  Vitals signs and nursing note reviewed.   Constitutional:       General: He is in acute distress ( Tearful).      Appearance: Normal appearance. He is normal weight. He is not ill-appearing or toxic-appearing.   HENT:      Head: Normocephalic and atraumatic.      Right Ear: External ear normal.      Left Ear: External ear normal.      Ears:      Comments: Small amount of swelling noted over the left eyebrow, left inner orbit     Nose: Congestion present.      Mouth/Throat:      Mouth: Mucous membranes are moist.      Pharynx: Oropharynx is clear.   Eyes:      Extraocular Movements: Extraocular movements intact. Conjunctiva/sclera: Conjunctivae normal.      Pupils: Pupils are equal, round, and reactive to light.   Neck:      Musculoskeletal: Normal range of motion and neck supple. No muscular tenderness.   Cardiovascular:      Rate and Rhythm: Normal rate and regular rhythm.  Pulses: Normal pulses.      Heart sounds: Normal heart sounds. No murmur.   Pulmonary:      Effort: Pulmonary effort is normal. No respiratory distress.      Breath sounds: Wheezing present.   Chest:      Chest wall: No tenderness.   Abdominal:      General: Bowel sounds are normal. There is no distension ( Diffuse).      Palpations: Abdomen is soft. There is no mass.      Tenderness: There is abdominal tenderness. There is right CVA tenderness and left CVA tenderness.      Hernia: No hernia is present.   Musculoskeletal: Normal range of motion.         General: No swelling or tenderness.      Right lower leg: No edema.      Left lower leg: No edema.   Skin:     General: Skin is warm and dry.      Capillary Refill: Capillary refill takes less than 2 seconds.      Coloration: Skin is not jaundiced or pale.      Findings: No rash.   Neurological:      General: No focal deficit present.      Mental Status: He is alert and oriented to person, place, and time. Mental status is at baseline.      Cranial Nerves: No cranial nerve deficit.      Sensory: No sensory deficit.   Psychiatric:         Mood and Affect: Mood normal.         Behavior: Behavior normal.         Laboratory Results:  Labs Reviewed   CBC AND DIFF - Abnormal       Result Value Ref Range Status    White Blood Cells 17.5 (*) 4.5 - 11.0 K/UL Final    RBC 4.68  4.4 - 5.5 M/UL Final    Hemoglobin 13.1 (*) 13.5 - 16.5 GM/DL Final    Hematocrit 16.1  40 - 50 % Final    MCV 86.8  80 - 100 FL Final    MCH 28.0  26 - 34 PG Final    MCHC 32.3  32.0 - 36.0 G/DL Final    RDW 09.6  11 - 15 % Final    Platelet Count 324  150 - 400 K/UL Final    MPV 8.9  7 - 11 FL Final Neutrophils 56  41 - 77 % Final    Lymphocytes 17 (*) 24 - 44 % Final    Monocytes 5  4 - 12 % Final    Eosinophils 21 (*) 0 - 5 % Final    Basophils 1  0 - 2 % Final    Absolute Neutrophil Count 9.90 (*) 1.8 - 7.0 K/UL Final    Absolute Lymph Count 2.93  1.0 - 4.8 K/UL Final    Absolute Monocyte Count 0.92 (*) 0 - 0.80 K/UL Final    Absolute Eosinophil Count 3.70 (*) 0 - 0.45 K/UL Final    Absolute Basophil Count 0.08  0 - 0.20 K/UL Final   COMPREHENSIVE METABOLIC PANEL - Abnormal    Sodium 140  137 - 147 MMOL/L Final    Potassium 3.9  3.5 - 5.1 MMOL/L Final    Chloride 103  98 - 110 MMOL/L Final    Glucose 68 (*) 70 - 100 MG/DL Final    Blood Urea Nitrogen  6 (*) 7 - 25 MG/DL Final    Creatinine 9.81  0.4 - 1.24 MG/DL Final    Calcium 9.5  8.5 - 10.6 MG/DL Final    Total Protein 7.3  6.0 - 8.0 G/DL Final    Total Bilirubin 0.9  0.3 - 1.2 MG/DL Final    Albumin 4.5  3.5 - 5.0 G/DL Final    Alk Phosphatase 73  25 - 110 U/L Final    AST (SGOT) 16  7 - 40 U/L Final    CO2 20 (*) 21 - 30 MMOL/L Final    ALT (SGPT) 17  7 - 56 U/L Final    Anion Gap 17 (*) 3 - 12 Final    eGFR Non African American >60  >60 mL/min Final    eGFR African American >60  >60 mL/min Final   URINALYSIS DIPSTICK REFLEX TO CULTURE - Abnormal    Color,UA YELLOW   Final    Turbidity,UA CLEAR  CLEAR-CLEAR Final    Specific Gravity-Urine 1.028  1.003 - 1.035 Final    pH,UA 6.0  5.0 - 8.0 Final    Protein,UA 1+ (*) NEG-NEG Final    Glucose,UA NEG  NEG-NEG Final    Ketones,UA 2+ (*) NEG-NEG Final    Bilirubin,UA NEG  NEG-NEG Final    Blood,UA 2+ (*) NEG-NEG Final    Urobilinogen,UA NORMAL  NORM-NORMAL Final    Nitrite,UA NEG  NEG-NEG Final    Leukocytes,UA NEG  NEG-NEG Final    Urine Ascorbic Acid, UA NEG  NEG-NEG Final   CULTURE-BLOOD W/SENSITIVITY   CULTURE-BLOOD W/SENSITIVITY   COVID-19 (SARS-COV-2) PCR   LIPASE    Lipase 18  11 - 82 U/L Final   URINALYSIS MICROSCOPIC REFLEX TO CULTURE    WBCs,UA 2-10  0 - 2 /HPF Final RBCs,UA 2-10  0 - 3 /HPF Final    Comment,UA     Final    Value: Criteria for reflex to culture are WBC>10, Positive Nitrite, and/or >=+1   leukocytes. If quantity is not sufficient, an addendum will follow.      MucousUA 3+   Final   POC TROPONIN    Troponin-I-POC 0.00  0.00 - 0.05 NG/ML Final   POC LACTATE    LACTIC ACID POC 1.0  0.5 - 2.0 MMOL/L Final   UA REFLEX CULTURE LABEL   LACTIC ACID (BG - RAPID LACTATE)   LACTIC ACID(LACTATE)   POC TROPONIN            ED Course:  ED Course as of Jan 25 1940   Thu Jan 25, 2019   [id number] 23 year old male with multiple recent other hospital ED visits for complaints of abdominal pain, chest pain, sinus congestion/pain and swelling, nausea and vomiting.  Patient also being worked up for a GPA for known eosinophilia.  Due to concern for multiple visit patient, symptoms unrelieved, CT abdomen findings of enteritis, as well as outside hospital leukocytosis, will repeat labs, CXR, EKG here and treat with IV fluids, Ativan, morphine for pain.    CBC remarkable for increased leukocytosis compared to labs done yesterday, marked eosinophilia, blood and ketones noted on UA.  CXR and EKG unremarkable.  Patient's pain largely resolved after Ativan and morphine as well as IV fluids.  Lactate 1.0, and blood cultures obtained due to leukocytosis.    Due to multiple visit patient, concern for possible autoimmune etiology, EGPA, infectious etiology, patient is needing of inpatient admission for further evaluation and treatment.  Findings discussed with patient and the plan for admission. Patient understands and agrees with plan. Patient reassessed with appropriate vital signs and symptom control for transfer. Patient verbalized understanding. Admission team was notified and patient was accepted to inpatient service. All questions were answered prior to transfer to inpatient service.    [JB]      ED Course User Index  [JB] Shea Evans, MD         MDM Reviewed: previous chart, nursing note and vitals  Reviewed previous: labs, ECG, x-ray and CT scan  Interpretation: labs, ECG and x-ray        Facility Administered Meds:  Medications   cetirizine (ZyrTEC) tablet 10 mg (has no administration in time range)   HYDROcodone/acetaminophen (NORCO) 5/325 mg tablet 1-2 tablet (has no administration in time range)   sodium chloride 0.9 %   infusion (has no administration in time range)   ondansetron (ZOFRAN) tablet 4 mg (has no administration in time range)     Or   ondansetron (ZOFRAN) injection 4 mg (has no administration in time range)   lactated ringers infusion (0 mL Intravenous Infusion Stopped 01/25/19 1747)   LORazepam (ATIVAN) injection 0.5 mg (0.5 mg Intravenous Given 01/25/19 1522)   morphine injection 4 mg (4 mg Intravenous Given 01/25/19 1626)         Clinical Impression:  Clinical Impression   Generalized abdominal pain   Nausea and vomiting, intractability of vomiting not specified, unspecified vomiting type   Watery stools   Acute recurrent sinusitis, unspecified location       Disposition/Follow up  ED Disposition     ED Disposition    Admit        No follow-up provider specified.    Medications:  New Prescriptions    No medications on file       Procedure Notes:  Procedures      Attestation / Supervision:  Stefani Dama, MD

## 2019-01-26 ENCOUNTER — Inpatient Hospital Stay: Admit: 2019-01-26 | Discharge: 2019-01-26 | Payer: MEDICAID | Primary: Adolescent Medicine

## 2019-01-26 ENCOUNTER — Encounter: Admit: 2019-01-26 | Discharge: 2019-01-26 | Payer: MEDICAID | Primary: Adolescent Medicine

## 2019-01-26 LAB — PERIPHERAL SMEAR

## 2019-01-26 LAB — TSH WITH FREE T4 REFLEX: Lab: 1.8 uU/mL — ABNORMAL LOW (ref >60)

## 2019-01-26 LAB — PHOSPHORUS: Lab: 3.4 mg/dL — ABNORMAL HIGH (ref >60)

## 2019-01-26 LAB — MAGNESIUM: Lab: 1.7 mg/dL — ABNORMAL LOW (ref >60)

## 2019-01-26 LAB — BASIC METABOLIC PANEL: Lab: 140 MMOL/L — ABNORMAL LOW (ref 137–147)

## 2019-01-26 LAB — PROTIME INR (PT): Lab: 1.2 MMOL/L — ABNORMAL LOW (ref 0.8–1.2)

## 2019-01-26 LAB — COVID-19 (SARS-COV-2) PCR

## 2019-01-26 LAB — CBC AND DIFF: Lab: 14 10*3/uL — ABNORMAL HIGH (ref 4.5–11.0)

## 2019-01-26 MED ORDER — INHALATIONAL SPACING DEVICE MISC SPCR
0 refills | Status: DC
Start: 2019-01-26 — End: 2019-02-07

## 2019-01-26 MED ORDER — GLUCAGON HCL 1 MG/ML IJ SOLR
.4 [IU] | Freq: Once | INTRAVENOUS | 0 refills | Status: CP
Start: 2019-01-26 — End: ?
  Administered 2019-01-27: 06:00:00 0.4 mg via INTRAVENOUS

## 2019-01-26 MED ORDER — GLUCAGON HCL 1 MG/ML IJ SOLR
.4 [IU] | Freq: Once | SUBCUTANEOUS | 0 refills | Status: CP
Start: 2019-01-26 — End: ?
  Administered 2019-01-27: 06:00:00 0.4 mg via SUBCUTANEOUS

## 2019-01-26 MED ORDER — MONTELUKAST 10 MG PO TAB
10 mg | Freq: Every evening | ORAL | 0 refills | Status: DC
Start: 2019-01-26 — End: 2019-02-07
  Administered 2019-01-27 – 2019-02-06 (×11): 10 mg via ORAL

## 2019-01-26 MED ORDER — MORPHINE 4 MG/ML IV SYRG
4 mg | Freq: Once | INTRAVENOUS | 0 refills | Status: CP
Start: 2019-01-26 — End: ?
  Administered 2019-01-26: 14:00:00 4 mg via INTRAVENOUS

## 2019-01-26 MED ORDER — BISACODYL 5 MG PO TBEC
10 mg | Freq: Once | ORAL | 0 refills | Status: AC | PRN
Start: 2019-01-26 — End: ?

## 2019-01-26 MED ORDER — BUDESONIDE-FORMOTEROL 160-4.5 MCG/ACTUATION IN HFAA
2 | Freq: Two times a day (BID) | RESPIRATORY_TRACT | 0 refills | Status: DC
Start: 2019-01-26 — End: 2019-02-07
  Administered 2019-01-27 – 2019-02-05 (×2): 2 via RESPIRATORY_TRACT

## 2019-01-26 MED ORDER — TRAMADOL 50 MG PO TAB
50 mg | ORAL | 0 refills | Status: DC | PRN
Start: 2019-01-26 — End: 2019-02-06
  Administered 2019-01-27 – 2019-02-05 (×29): 50 mg via ORAL

## 2019-01-26 MED ORDER — PEG-ELECTROLYTE SOLN 420 GRAM PO SOLR
4 L | ORAL | 0 refills | Status: DC
Start: 2019-01-26 — End: 2019-02-07
  Administered 2019-01-28: 20:00:00 4 L via ORAL

## 2019-01-26 MED ORDER — LORAZEPAM 2 MG/ML IJ SOLN
.5 mg | INTRAVENOUS | 0 refills | Status: CP | PRN
Start: 2019-01-26 — End: ?
  Administered 2019-01-27 (×3): 0.5 mg via INTRAVENOUS

## 2019-01-26 MED ORDER — ACETAMINOPHEN 325 MG PO TAB
650 mg | ORAL | 0 refills | Status: DC | PRN
Start: 2019-01-26 — End: 2019-02-07

## 2019-01-26 MED ORDER — PEG-ELECTROLYTE SOLN 420 GRAM PO SOLR
2 L | ORAL | 0 refills | Status: DC | PRN
Start: 2019-01-26 — End: 2019-02-07
  Administered 2019-01-28: 13:00:00 2 L via ORAL

## 2019-01-26 MED ORDER — MORPHINE 4 MG/ML IV SYRG
2 mg | Freq: Once | INTRAVENOUS | 0 refills | Status: CP
Start: 2019-01-26 — End: ?
  Administered 2019-01-26: 23:00:00 2 mg via INTRAVENOUS

## 2019-01-26 MED ORDER — HYDROXYZINE HCL 25 MG PO TAB
25 mg | Freq: Three times a day (TID) | ORAL | 0 refills | Status: DC | PRN
Start: 2019-01-26 — End: 2019-02-07
  Administered 2019-01-27 – 2019-02-06 (×15): 25 mg via ORAL

## 2019-01-26 MED ORDER — ALPRAZOLAM 0.5 MG PO TAB
0.25 mg | Freq: Once | ORAL | 0 refills | Status: DC
Start: 2019-01-26 — End: 2019-01-27

## 2019-01-26 NOTE — Progress Notes
2230. Pt arrived to room per [facility] w/ transport escort. Able to ambulate from wc to bed without difficulty. Pt actively vomiting, c/o severe pain. Requests IV pain medication, state he threw up PO pain medication in ER. MD on call notified, orders placed for IV Morphine & Compazine.   2315. Pt c/o heartburn. MD paged w/ call back, order placed for GI Cocktail. Pt signed consent for Medical records from [facility]. Called Zena Amos to verify fax # [phone]. Consent faxed @2330 ; fax confirmatin/successful fax obtained. Will cont to monitor.

## 2019-01-26 NOTE — Progress Notes
RT Adult Assessment Note    NAME:Jonathon Simon             MRN: [id number]             DOB:1995/06/01          AGE: 23 y.o.  ADMISSION DATE: 01/25/2019             DAYS ADMITTED: LOS: 0 days    RT Treatment Plan:  Protocol Plan: Medications  Albuterol: MDI PRN(home regimen)         Additional Comments:  Impressions of the patient: No complaints of chest discomfort at this time, but his aving episdes of nausea & vomiting at this time.  RN is aware and currently getting medication for pt, resting on RA.  Intervention(s)/outcome(s): N/A  Patient education that was completed: Cough for initial assessment.  Recommendations to the care team: Communicate w/ RT regarding PRN inhaler.    Vital Signs:  Pulse: 72  RR: 18 PER MINUTE  SpO2: 98 %  O2%: (Room Air)  Breath Sounds: Clear (Implies normal)  Respiratory Effort: Non-Labored

## 2019-01-26 NOTE — H&P (View-Only)
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 Abdominal pain, constipation, rectal bleeding  - check KUB  - has had ongoing symptoms, not able to eat  - aggressive bowel regimen ordered  - CLD  - IVF  - consult GI for possible endoscopy to evaluate for GI involvement of AI disease    2. Chest Discomfort  - EKG with sinus rhythm, no ST changes  - troponin neg x1, will get 2 more  - CXR unremarkable  - reports history of Tricuspid regurgitation, had echocardiogram at [facility] about 3 weeks ago, will send records request    3. 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 Chronic Sinusitis and Nasal polyps  - follows with Dr. Sol Blazing in ENT clinic  - restart flonase and zyrtec    5. High Anion Gap Metabolic Acidosis  - mild  - likely due to persistent nausea and vomiting  - lactate normal  - start IVF, repeat BMP in am    FEN  - lytes stable  - CLD  - NS at 122ml/hr    Ppx  - SCDs only due to rectal bleeding    Disp  - admit to medicine  __________________________________________________________________________________  Primary Care Physician: Bridgette Habermann  Verified    Chief Complaint:  Abdominal pain, chest discomfort, nausea, vomiting, constipation, rectal bleeding  History of Present Illness: Jonathon Simon is a 23 y.o. male who presented to the ED for the above complaints.  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  He has nausea and vomiting for about 6 days, he has been unable to take anything by mouth.  He has severe constipation and has been straining to have a BM, he also reports bright red blood per rectum.  He has chest discomfort for the past several days, cough, occasional dyspnea, chills.  He denies any fever.  He has lightheadedness for the past several days.      Medical History:   Diagnosis Date   ? Anxiety    ? Asthma    ? GERD (gastroesophageal reflux disease)    ? Nasal polyps    ? No blood products     Pt is Jehovah's Witness, does not want any blood products.    ? Rhinitis    ? Sinusitis      Surgical History:   Procedure Laterality Date   ? NASAL ENDOSCOPY WITH TOTAL ETHMOIDECTOMY AND SPHENOIDOTOMY WITH TISSUE REMOVAL Bilateral 10/29/2016    Performed by Lamar Benes, MD at [facility]   ?  NASAL ENDOSCOPY WITH MAXILLARY ANTROSTOMY WITH TISSUE MAXILLARY REMOVAL Bilateral 10/29/2016    Performed by Lamar Benes, MD at [facility]   ? NASAL ENDOSCOPY WITH FRONTAL SINUSOTOMY Bilateral 10/29/2016    Performed by Lamar Benes, MD at [facility]   ? FUNCTIONAL ENDOSCOPY SINUS SURGERY IMAGE-GUIDED Bilateral 10/29/2016    Performed by Lamar Benes, MD at [facility]   ? SUBMUCOUS RESECTION TURBINATE Bilateral 10/29/2016    Performed by Lamar Benes, MD at [facility]   ? BIOPSY      biopsy on pt's penis     Family history reviewed; non-contributory  Social History     Socioeconomic History   ? Marital status: Single     Spouse name: Not on file   ? Number of children: Not on file   ? Years of education: Not on file   ? Highest education level: Not on file   Occupational History   ? Not on file   Social Needs   ? Financial resource strain: Not on file   ? Food insecurity     Worry: Not on file     Inability: Not on file   ? Transportation needs     Medical: Not on file     Non-medical: Not on file   Tobacco Use   ? Smoking status: Never Smoker ? Smokeless tobacco: Never Used   Substance and Sexual Activity   ? Alcohol use: No     Comment: pt states I would drink on occasion in the past.   ? Drug use: No   ? Sexual activity: Not on file   Lifestyle   ? Physical activity     Days per week: Not on file     Minutes per session: Not on file   ? Stress: Not on file   Relationships   ? Social Wellsite geologist on phone: Not on file     Gets together: Not on file     Attends religious service: Not on file     Active member of club or organization: Not on file     Attends meetings of clubs or organizations: Not on file     Relationship status: Not on file   ? Intimate partner violence     Fear of current or ex partner: Not on file     Emotionally abused: Not on file     Physically abused: Not on file     Forced sexual activity: Not on file   Other Topics Concern   ? Not on file   Social History Narrative   ? Not on file            Immunizations (includes history and patient reported):   There is no immunization history on file for this patient.        Allergies:  Iodine; Peanut; and Tylenol [acetaminophen]    Medications:  Current Facility-Administered Medications   Medication   ? bacitracin topical ointment     Current Outpatient Medications   Medication Sig   ? albuterol (PROAIR HFA, VENTOLIN HFA, OR PROVENTIL HFA) 90 mcg/actuation inhaler Inhale two puffs by mouth into the lungs every 4 hours as needed for Wheezing or Shortness of Breath. Shake well before use.   ? alprazolam (XANAX PO) Take 0.25 mg by mouth.   ? AMOXICILLIN PO Take  by mouth.   ? AUVI-Q 0.3 mg/0.3 mL auto-injector Inject 0.3 mg (1 Pen) into thigh  if needed for anaphylactic reaction. May repeat in 5-15 minutes if needed.   ? AUVI-Q 0.3 mg/0.3 mL auto-injector Inject 0.3 mg (1 Pen) into thigh if needed for anaphylactic reaction. May repeat in 5-15 minutes if needed.   ? budesonide(+) (RHINOCORT ALLERGY) 32 mcg/actuation nasal spray Apply 1 spray to each nostril as directed daily. ? cetirizine (ZYRTEC) 10 mg tablet Take 1 tablet by mouth every morning.   ? ciprofloxacin(+) (CILOXIN) 0.3 % ophthalmic solution 2 drops to affected eye BID for 7 days when infected   ? docosahexanoic acid/epa (FISH OIL PO) Take  by mouth.   ? dupilumab (DUPIXENT) 300 mg/2 mL injectable SYRINGE Inject 2 syringes under the skin once on day 1, then decrease to 1 syringe every 14 days starting on day 15.   ? fluticasone (FLONASE) 50 mcg/actuation nasal spray Apply two sprays to each nostril as directed daily. Shake bottle gently before using.   ? fluticasone (FLONASE) 50 mcg/actuation nasal spray Apply  to each nostril as directed daily. Shake bottle gently before using.   ? homeopathic drugs (ARNICA ESSENCE PO) Take  by mouth.   ? HYDROcodone/acetaminophen (NORCO) 5/325 mg tablet Take 1 tablet by mouth every 6 hours as needed for Pain   ? hydrocortisone 2.5 % topical ointment Apply  topically to affected area twice daily.   ? montelukast (SINGULAIR) 10 mg tablet Take one tablet by mouth at bedtime daily.   ? montelukast (SINGULAIR) 10 mg tablet Take 1 tablet by mouth at bedtime daily.   ? morphine IR (MS-IR) 15 mg tablet Take 15 mg by mouth every 4 hours as needed for Pain   ? MULTIVITAMIN PO Take 1 tablet by mouth daily.   ? naproxen (NAPROSYN) 500 mg tablet Take one tablet by mouth twice daily with meals. Take with food.   ? ondansetron (ZOFRAN) 4 mg tablet Take one tablet by mouth every 8 hours as needed for Nausea.   ? other medication 1 Dose. Comfrey Oil   ? other medication 1 Dose. Phospitor   ? other medication 1 Dose. Glutacor   ? oxyCODONE (ROXICODONE, OXY-IR) 5 mg tablet Take 1 tablet by mouth every 6 hours as needed for Pain   ? polyethylene glycol 3350 (MIRALAX) 17 g packet Take 17 g by mouth daily.   ? prednisone (DELTASONE) 10 mg tablet Take three tablets by mouth daily.   ? prednisone (DELTASONE) 10 mg tablet Take 5 tabs daily for 4 days. Then 4 tabs for 4 days, then 3 tabs for 4 days, then 2 tabs for 4 days, then 1 tab for 4 days   ? sinus rinse pack kit Apply 1 packet into nose as directed as Needed.     Review of Systems:  A 14 point review of systems was negative except for: chest pain, dyspnea, cough, chills, abdominal pain, nausea, vomiting, constipation, rectal bleeding, sinus congestion, dizziness    Physical Exam:  Vital Signs: Last Filed In 24 Hours Vital Signs: 24 Hour Range   BP: 136/83 (10/08 1531)  Temp: 36.9 ?C (98.5 ?F) (10/08 1131)  Pulse: 73 (10/08 1812)  Respirations: 16 PER MINUTE (10/08 1812)  SpO2: 97 % (10/08 1812)  SpO2 Pulse: 73 (10/08 1812)  Height: 182.9 cm (72) (10/08 1131) BP: (107-136)/(54-97)   Temp:  [36.9 ?C (98.5 ?F)]   Pulse:  [64-86]   Respirations:  [12 PER MINUTE-21 PER MINUTE]   SpO2:  [96 %-100 %]    Intensity Pain Scale (Self Report): 6 (01/25/19  1631)      General:  Alert, cooperative, no distress, appears stated age  Head:  Normocephalic, without obvious abnormality, atraumatic  Eyes:  perrla, eomi  Throat:  MMM  Neck:  supple, symmetrical, no cervical LAD  Back:  Symmetric, no curvature, ROM normal.  No CVA tenderness.  Lungs:  Clear to auscultation bilaterally  Heart:    Regular rate and rhythm, S1, S2 normal, no murmur, click rub or gallop  Abdomen:  soft, mild generalized tenderness, no guarding or rebound, bs+  Extremities:  Extremities normal, atraumatic, no cyanosis or edema  Peripheral pulses:   2+ and symmetric, all extremities  Skin:   Skin color, texture, turgor normal.  No rashes or lesions    Lab/Radiology/Other Diagnostic Tests:  24-hour labs:    Results for orders placed or performed during the hospital encounter of 01/25/19 (from the past 24 hour(s))   CBC AND DIFF    Collection Time: 01/25/19  3:00 PM   Result Value Ref Range    White Blood Cells 17.5 (H) 4.5 - 11.0 K/UL    RBC 4.68 4.4 - 5.5 M/UL    Hemoglobin 13.1 (L) 13.5 - 16.5 GM/DL    Hematocrit 09.8 40 - 50 %    MCV 86.8 80 - 100 FL MCH 28.0 26 - 34 PG    MCHC 32.3 32.0 - 36.0 G/DL    RDW 11.9 11 - 15 %    Platelet Count 324 150 - 400 K/UL    MPV 8.9 7 - 11 FL    Neutrophils 56 41 - 77 %    Lymphocytes 17 (L) 24 - 44 %    Monocytes 5 4 - 12 %    Eosinophils 21 (H) 0 - 5 %    Basophils 1 0 - 2 %    Absolute Neutrophil Count 9.90 (H) 1.8 - 7.0 K/UL    Absolute Lymph Count 2.93 1.0 - 4.8 K/UL    Absolute Monocyte Count 0.92 (H) 0 - 0.80 K/UL    Absolute Eosinophil Count 3.70 (H) 0 - 0.45 K/UL    Absolute Basophil Count 0.08 0 - 0.20 K/UL   COMPREHENSIVE METABOLIC PANEL    Collection Time: 01/25/19  3:00 PM   Result Value Ref Range    Sodium 140 137 - 147 MMOL/L    Potassium 3.9 3.5 - 5.1 MMOL/L    Chloride 103 98 - 110 MMOL/L    Glucose 68 (L) 70 - 100 MG/DL    Blood Urea Nitrogen 6 (L) 7 - 25 MG/DL    Creatinine 1.47 0.4 - 1.24 MG/DL    Calcium 9.5 8.5 - 82.9 MG/DL    Total Protein 7.3 6.0 - 8.0 G/DL    Total Bilirubin 0.9 0.3 - 1.2 MG/DL    Albumin 4.5 3.5 - 5.0 G/DL    Alk Phosphatase 73 25 - 110 U/L    AST (SGOT) 16 7 - 40 U/L    CO2 20 (L) 21 - 30 MMOL/L    ALT (SGPT) 17 7 - 56 U/L    Anion Gap 17 (H) 3 - 12    eGFR Non African American >60 >60 mL/min    eGFR African American >60 >60 mL/min   LIPASE    Collection Time: 01/25/19  3:00 PM   Result Value Ref Range    Lipase 18 11 - 82 U/L   POC TROPONIN    Collection Time: 01/25/19  3:04 PM   Result Value Ref Range  Troponin-I-POC 0.00 0.00 - 0.05 NG/ML   URINALYSIS DIPSTICK REFLEX TO CULTURE    Collection Time: 01/25/19  3:36 PM    Specimen: Urine   Result Value Ref Range    Color,UA YELLOW     Turbidity,UA CLEAR CLEAR-CLEAR    Specific Gravity-Urine 1.028 1.003 - 1.035    pH,UA 6.0 5.0 - 8.0    Protein,UA 1+ (A) NEG-NEG    Glucose,UA NEG NEG-NEG    Ketones,UA 2+ (A) NEG-NEG    Bilirubin,UA NEG NEG-NEG    Blood,UA 2+ (A) NEG-NEG    Urobilinogen,UA NORMAL NORM-NORMAL    Nitrite,UA NEG NEG-NEG    Leukocytes,UA NEG NEG-NEG    Urine Ascorbic Acid, UA NEG NEG-NEG URINALYSIS MICROSCOPIC REFLEX TO CULTURE    Collection Time: 01/25/19  3:36 PM    Specimen: Urine   Result Value Ref Range    WBCs,UA 2-10 0 - 2 /HPF    RBCs,UA 2-10 0 - 3 /HPF    Comment,UA       Criteria for reflex to culture are WBC>10, Positive Nitrite, and/or >=+1   leukocytes. If quantity is not sufficient, an addendum will follow.      MucousUA 3+    POC LACTATE    Collection Time: 01/25/19  6:19 PM   Result Value Ref Range    LACTIC ACID POC 1.0 0.5 - 2.0 MMOL/L     Glucose: (!) 68 (01/25/19 1500)  Pertinent radiology reviewed.    Yolanda Manges, DO  Pager [phone]

## 2019-01-27 LAB — 25-OH VITAMIN D (D2 + D3): Lab: 18 ng/mL — ABNORMAL LOW (ref 30–80)

## 2019-01-27 LAB — MISC REFERENCE TEST

## 2019-01-27 LAB — GIARDIA SCREEN,FECAL: Lab: NEGATIVE

## 2019-01-27 LAB — VITAMIN B12: Lab: 150 pg/mL — ABNORMAL HIGH (ref 180–914)

## 2019-01-27 LAB — CRYPTOSPORIDUM,FECAL

## 2019-01-27 LAB — FOLATE, SERUM: Lab: 19 ng/mL — ABNORMAL LOW (ref >3.9)

## 2019-01-27 LAB — C DIFFICILE BY PCR: Lab: NEGATIVE

## 2019-01-27 LAB — SED RATE: Lab: 2 mm/h — ABNORMAL HIGH (ref >60)

## 2019-01-27 LAB — IRON + BINDING CAPACITY + %SAT+ FERRITIN: Lab: 54 ug/dL — ABNORMAL HIGH (ref 50–185)

## 2019-01-27 LAB — C REACTIVE PROTEIN (CRP): Lab: 0.2 mg/dL — ABNORMAL HIGH (ref <1.0)

## 2019-01-27 LAB — MAGNESIUM: Lab: 1.8 mg/dL — ABNORMAL LOW (ref >60)

## 2019-01-27 LAB — POC GLUCOSE: Lab: 89 mg/dL (ref 70–100)

## 2019-01-27 LAB — BASIC METABOLIC PANEL: Lab: 141 MMOL/L — ABNORMAL LOW (ref 137–147)

## 2019-01-27 LAB — CBC AND DIFF: Lab: 14 10*3/uL — ABNORMAL HIGH (ref 4.5–11.0)

## 2019-01-27 LAB — PHOSPHORUS: Lab: 4.2 mg/dL — ABNORMAL LOW (ref >60)

## 2019-01-27 MED ORDER — BREEZA FOR NEUTRAL ABDOMINAL/PELVIC IMAGING PO SOLN
1500 mL | Freq: Once | ORAL | 0 refills | Status: CP
Start: 2019-01-27 — End: ?

## 2019-01-27 MED ORDER — GADOBENATE DIMEGLUMINE 529 MG/ML (0.1MMOL/0.2ML) IV SOLN
20 mL | Freq: Once | INTRAVENOUS | 0 refills | Status: CP
Start: 2019-01-27 — End: ?
  Administered 2019-01-27: 07:00:00 20 mL via INTRAVENOUS

## 2019-01-27 MED ORDER — LORAZEPAM 2 MG/ML IJ SOLN
.5-1 mg | INTRAVENOUS | 0 refills | Status: DC | PRN
Start: 2019-01-27 — End: 2019-02-06
  Administered 2019-01-28: 11:00:00 0.5 mg via INTRAVENOUS
  Administered 2019-01-28: 20:00:00 1 mg via INTRAVENOUS
  Administered 2019-01-28 – 2019-02-06 (×24): 0.5 mg via INTRAVENOUS

## 2019-01-27 MED ORDER — SODIUM CHLORIDE 0.9 % IJ SOLN
50 mL | Freq: Once | INTRAVENOUS | 0 refills | Status: CP
Start: 2019-01-27 — End: ?

## 2019-01-28 LAB — PHOSPHORUS: Lab: 4 mg/dL — ABNORMAL HIGH (ref >60)

## 2019-01-28 LAB — MAGNESIUM: Lab: 1.8 mg/dL — ABNORMAL LOW (ref 1.6–2.6)

## 2019-01-28 LAB — BASIC METABOLIC PANEL: Lab: 141 MMOL/L — ABNORMAL LOW (ref 137–147)

## 2019-01-29 ENCOUNTER — Inpatient Hospital Stay: Admit: 2019-01-29 | Discharge: 2019-01-29 | Payer: MEDICAID | Primary: Adolescent Medicine

## 2019-01-29 ENCOUNTER — Encounter: Admit: 2019-01-29 | Discharge: 2019-01-29 | Payer: MEDICAID | Primary: Adolescent Medicine

## 2019-01-29 DIAGNOSIS — J45909 Unspecified asthma, uncomplicated: Secondary | ICD-10-CM

## 2019-01-29 DIAGNOSIS — J310 Chronic rhinitis: Secondary | ICD-10-CM

## 2019-01-29 DIAGNOSIS — J329 Chronic sinusitis, unspecified: Secondary | ICD-10-CM

## 2019-01-29 DIAGNOSIS — K219 Gastro-esophageal reflux disease without esophagitis: Secondary | ICD-10-CM

## 2019-01-29 DIAGNOSIS — J339 Nasal polyp, unspecified: Secondary | ICD-10-CM

## 2019-01-29 DIAGNOSIS — Z789 Other specified health status: Secondary | ICD-10-CM

## 2019-01-29 DIAGNOSIS — F419 Anxiety disorder, unspecified: Secondary | ICD-10-CM

## 2019-01-29 LAB — FECAL FAT (TOTAL LIPIDS): Lab: 26

## 2019-01-29 LAB — BASIC METABOLIC PANEL: Lab: 140 MMOL/L — ABNORMAL HIGH (ref 137–147)

## 2019-01-29 LAB — MAGNESIUM: Lab: 1.6 mg/dL — ABNORMAL LOW (ref >60)

## 2019-01-29 LAB — CBC AND DIFF
Lab: 12 K/UL — ABNORMAL HIGH (ref >60)
Lab: 12 K/UL — ABNORMAL HIGH (ref >60)

## 2019-01-29 LAB — PHOSPHORUS: Lab: 4.1 mg/dL — ABNORMAL LOW (ref 2.0–4.5)

## 2019-01-29 MED ORDER — ERGOCALCIFEROL (VITAMIN D2) 1,250 MCG (50,000 UNIT) PO CAP
50000 [IU] | ORAL | 0 refills | Status: DC
Start: 2019-01-29 — End: 2019-02-07
  Administered 2019-01-30 – 2019-02-06 (×2): 50000 [IU] via ORAL

## 2019-01-29 MED ORDER — LIDOCAINE (PF) 200 MG/10 ML (2 %) IJ SYRG
0 refills | Status: DC
Start: 2019-01-29 — End: 2019-01-29
  Administered 2019-01-29 (×2): 100 mg via INTRAVENOUS

## 2019-01-29 MED ORDER — ALBUTEROL SULFATE 90 MCG/ACTUATION IN HFAA
0 refills | Status: DC
Start: 2019-01-29 — End: 2019-01-29
  Administered 2019-01-29: 19:00:00 4 via RESPIRATORY_TRACT

## 2019-01-29 MED ORDER — MIDAZOLAM 1 MG/ML IJ SOLN
INTRAVENOUS | 0 refills | Status: DC
Start: 2019-01-29 — End: 2019-01-29
  Administered 2019-01-29: 18:00:00 4 mg via INTRAVENOUS

## 2019-01-29 MED ORDER — MAGNESIUM SULFATE IN D5W 1 GRAM/100 ML IV PGBK
1 g | INTRAVENOUS | 0 refills | Status: CP
Start: 2019-01-29 — End: ?
  Administered 2019-01-29: 14:00:00 1 g via INTRAVENOUS

## 2019-01-29 MED ORDER — PROPOFOL INJ 10 MG/ML IV VIAL
0 refills | Status: DC
Start: 2019-01-29 — End: 2019-01-29
  Administered 2019-01-29: 19:00:00 30 mg via INTRAVENOUS
  Administered 2019-01-29: 19:00:00 20 mg via INTRAVENOUS

## 2019-01-29 MED ORDER — PROPOFOL 10 MG/ML IV EMUL 50 ML (INFUSION)(AM)(OR)
INTRAVENOUS | 0 refills | Status: DC
Start: 2019-01-29 — End: 2019-01-29
  Administered 2019-01-29: 18:00:00 120 ug/kg/min via INTRAVENOUS

## 2019-01-29 MED ORDER — LACTATED RINGERS IV SOLP
0 refills | Status: DC
Start: 2019-01-29 — End: 2019-01-29
  Administered 2019-01-29: 18:00:00 via INTRAVENOUS

## 2019-01-29 MED ORDER — GLYCOPYRROLATE 0.2 MG/ML IJ SOLN
0 refills | Status: DC
Start: 2019-01-29 — End: 2019-01-29
  Administered 2019-01-29 (×2): 0.2 mg via INTRAVENOUS

## 2019-01-29 NOTE — Anesthesia Pre-Procedure Evaluation
Anesthesia Pre-Procedure Evaluation    Name: Jonathon Simon      MRN: [id number]     DOB: 1995/10/06     Age: 23 y.o.     Sex: male   __________________________________________________________________________     Procedure Date: 01/29/2019   Procedure: Procedure(s):  Colonoscopy  SMALL INTESTINAL ENTEROSCOPY BEYOND SECOND PORTION DUODENUM WITH BIOPSY     Physical Assessment  Vital Signs (last filed in past 24 hours):  BP: 144/83 (10/12 0935)  Temp: 36.6 ?C (97.8 ?F) (10/12 0935)  Pulse: 50 (10/12 0935)  Respirations: 16 PER MINUTE (10/12 0935)  SpO2: 95 % (10/12 0935)      Patient History  Allergies   Allergen Reactions   ? Iodine ANAPHYLAXIS   ? Peanut ANAPHYLAXIS   ? Tylenol [Acetaminophen] NAUSEA ONLY        Current Medications    Medication Directions   albuterol (PROAIR HFA, VENTOLIN HFA, OR PROVENTIL HFA) 90 mcg/actuation inhaler Inhale two puffs by mouth into the lungs every 4 hours as needed for Wheezing or Shortness of Breath. Shake well before use.   AUVI-Q 0.3 mg/0.3 mL auto-injector Inject 0.3 mg (1 Pen) into thigh if needed for anaphylactic reaction. May repeat in 5-15 minutes if needed.   AUVI-Q 0.3 mg/0.3 mL auto-injector Inject 0.3 mg (1 Pen) into thigh if needed for anaphylactic reaction. May repeat in 5-15 minutes if needed.   dupilumab (DUPIXENT) 300 mg/2 mL injectable SYRINGE Inject 2 syringes under the skin once on day 1, then decrease to 1 syringe every 14 days starting on day 15.   polyethylene glycol 3350 (MIRALAX) 17 g packet Take 17 g by mouth daily.     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 intractable nausea/vaomiting (none since admission)     Review of Systems/Medical History      Patient summary reviewed  Nursing notes reviewed    PONV Screening: Non-smoker      Airway - negative        Pulmonary           Asthma Last inhalers used 2 month age       Cardiovascular - negative        Exercise tolerance: <4 METS (limited by mucocele & sinus issues. )      GI/Hepatic/Renal           GERD, well controlled      Neuro/Psych - negative        Musculoskeletal - negative        Endocrine/Other - negative     Physical Exam    Airway Findings      Mallampati: I      TM distance: >3 FB      Neck ROM: full      Mouth opening: good      Airway patency: adequate    Dental Findings: Negative            Cardiovascular Findings: Negative      Pulmonary Findings: Negative      Abdominal Findings: Negative      Neurological Findings: Negative         Diagnostic Tests  Hematology:   Lab Results   Component Value Date    HGB 12.0 01/29/2019    HCT 36.8 01/29/2019    PLTCT 291 01/29/2019    WBC 12.7 01/29/2019    NEUT 38  01/29/2019    ANC 4.83 01/29/2019    ALC 2.88 01/29/2019    MONA 5 01/29/2019    AMC 0.68 01/29/2019    EOSA 33 01/29/2019    ABC 0.14 01/29/2019    MCV 86.8 01/29/2019    MCH 28.3 01/29/2019    MCHC 32.6 01/29/2019    MPV 7.8 01/29/2019    RDW 14.0 01/29/2019         General Chemistry:   Lab Results   Component Value Date    NA 140 01/29/2019    K 3.7 01/29/2019    CL 104 01/29/2019    CO2 30 01/29/2019    GAP 6 01/29/2019    BUN 3 01/29/2019    CR 0.88 01/29/2019    GLU 82 01/29/2019    CA 9.3 01/29/2019    ALBUMIN 4.5 01/25/2019    MG 1.6 01/29/2019    TOTBILI 0.9 01/25/2019    PO4 4.1 01/29/2019      Coagulation:   Lab Results   Component Value Date    INR 1.2 01/26/2019         Anesthesia Plan    ASA score: 2   Plan: MAC  Induction method: intravenous      Informed Consent  Anesthetic plan and risks discussed with patient.  Use of blood products discussed with patient and mother  Special considerations: Jehovah's Witness.      Plan discussed with: anesthesiologist and CRNA.

## 2019-01-30 ENCOUNTER — Encounter: Admit: 2019-01-30 | Discharge: 2019-01-30 | Payer: MEDICAID | Primary: Adolescent Medicine

## 2019-01-30 ENCOUNTER — Inpatient Hospital Stay: Admit: 2019-01-30 | Discharge: 2019-01-30 | Payer: MEDICAID | Primary: Adolescent Medicine

## 2019-01-30 DIAGNOSIS — K219 Gastro-esophageal reflux disease without esophagitis: Secondary | ICD-10-CM

## 2019-01-30 DIAGNOSIS — J310 Chronic rhinitis: Secondary | ICD-10-CM

## 2019-01-30 DIAGNOSIS — Z789 Other specified health status: Secondary | ICD-10-CM

## 2019-01-30 DIAGNOSIS — J45909 Unspecified asthma, uncomplicated: Secondary | ICD-10-CM

## 2019-01-30 DIAGNOSIS — J329 Chronic sinusitis, unspecified: Secondary | ICD-10-CM

## 2019-01-30 DIAGNOSIS — J339 Nasal polyp, unspecified: Secondary | ICD-10-CM

## 2019-01-30 DIAGNOSIS — F419 Anxiety disorder, unspecified: Secondary | ICD-10-CM

## 2019-01-30 LAB — CORTISOL,RANDOM: Lab: 3.3 ug/dL — ABNORMAL LOW (ref 5.0–20.0)

## 2019-01-30 LAB — BASIC METABOLIC PANEL: Lab: 141 MMOL/L — ABNORMAL HIGH (ref >60)

## 2019-01-30 LAB — HIV 1& 2 AG-AB SCRN W REFLEX HIV 1 PCR QUANT

## 2019-01-30 LAB — CBC AND DIFF: Lab: 14 K/UL — ABNORMAL HIGH (ref 4.5–11.0)

## 2019-01-30 LAB — MAGNESIUM: Lab: 2 mg/dL — ABNORMAL LOW (ref 1.6–2.6)

## 2019-01-30 LAB — PHOSPHORUS: Lab: 5.7 mg/dL — ABNORMAL HIGH (ref >60)

## 2019-01-30 LAB — CULTURE-FECES W/SENSITIVITY

## 2019-01-30 MED ORDER — LACTATED RINGERS IV SOLP
INTRAVENOUS | 0 refills | Status: AC
Start: 2019-01-30 — End: ?
  Administered 2019-01-30 – 2019-01-31 (×3): 1000.000 mL via INTRAVENOUS

## 2019-01-30 MED ORDER — OXYCODONE 5 MG PO TAB
5-10 mg | ORAL | 0 refills | Status: DC | PRN
Start: 2019-01-30 — End: 2019-02-07
  Administered 2019-01-30 – 2019-02-06 (×27): 10 mg via ORAL

## 2019-01-30 MED ORDER — PANTOPRAZOLE 40 MG PO TBEC
40 mg | Freq: Every day | ORAL | 0 refills | Status: DC
Start: 2019-01-30 — End: 2019-02-01
  Administered 2019-01-31 – 2019-02-01 (×2): 40 mg via ORAL

## 2019-01-30 MED ORDER — DICYCLOMINE 10 MG PO CAP
10 mg | Freq: Before meals | ORAL | 0 refills | Status: DC
Start: 2019-01-30 — End: 2019-02-07
  Administered 2019-01-30 – 2019-02-06 (×28): 10 mg via ORAL

## 2019-01-30 MED ORDER — MORPHINE 4 MG/ML IV SYRG
2-4 mg | INTRAVENOUS | 0 refills | Status: DC | PRN
Start: 2019-01-30 — End: 2019-02-06
  Administered 2019-01-30 – 2019-02-05 (×19): 4 mg via INTRAVENOUS
  Administered 2019-02-06 (×3): 2 mg via INTRAVENOUS

## 2019-01-31 ENCOUNTER — Inpatient Hospital Stay: Admit: 2019-01-31 | Discharge: 2019-01-31 | Payer: MEDICAID | Primary: Adolescent Medicine

## 2019-01-31 ENCOUNTER — Encounter: Admit: 2019-01-31 | Discharge: 2019-01-31 | Payer: MEDICAID | Primary: Adolescent Medicine

## 2019-01-31 LAB — URINALYSIS DIPSTICK
Lab: NEGATIVE
Lab: NEGATIVE
Lab: NEGATIVE
Lab: NEGATIVE
Lab: NEGATIVE
Lab: NEGATIVE

## 2019-01-31 LAB — CULTURE-BLOOD W/SENSITIVITY

## 2019-01-31 LAB — ELECTROPHORESIS-SERUM PROTEIN
Lab: 11 % (ref 9–17)
Lab: 12 % (ref 9–21)
Lab: 56 % (ref 48–68)
Lab: 6.1 g/dL (ref 6.0–8.0)

## 2019-01-31 LAB — CBC AND DIFF: Lab: 11 K/UL — ABNORMAL HIGH (ref >60)

## 2019-01-31 LAB — HEPATITIS C ANTIBODY W REFLEX HCV PCR QUANT

## 2019-01-31 LAB — IMMUNOGLOBULINS-IGA,IGG,IGM
Lab: 189 mg/dL (ref 70–390)
Lab: 780 mg/dL (ref 762–1488)

## 2019-01-31 LAB — MPO/PR3 W REFLEX TO ANCA

## 2019-01-31 LAB — URINALYSIS, MICROSCOPIC

## 2019-01-31 LAB — BASIC METABOLIC PANEL: Lab: 138 MMOL/L — ABNORMAL LOW (ref 137–147)

## 2019-01-31 LAB — PHOSPHORUS: Lab: 3.7 mg/dL — ABNORMAL LOW (ref 2.0–4.5)

## 2019-01-31 LAB — HEPATITIS B SURFACE AG

## 2019-01-31 LAB — HEPATITIS A IGM

## 2019-01-31 LAB — IMMUNOGLOBULIN E (IGE): Lab: 321 [IU]/mL — ABNORMAL HIGH (ref <101)

## 2019-01-31 LAB — MAGNESIUM: Lab: 1.8 mg/dL — ABNORMAL LOW (ref >60)

## 2019-01-31 LAB — HEPATITIS B SURFACE AB: Lab: POSITIVE — AB

## 2019-01-31 MED ORDER — COSYNTROPIN 0.25 MG IJ SOLR
.25 mg | Freq: Once | INTRAVENOUS | 0 refills | Status: CP
Start: 2019-01-31 — End: ?
  Administered 2019-02-01: 16:00:00 0.25 mg via INTRAVENOUS

## 2019-02-01 ENCOUNTER — Encounter: Admit: 2019-02-01 | Discharge: 2019-02-01 | Payer: MEDICAID | Primary: Adolescent Medicine

## 2019-02-01 LAB — IGG SUBCLASSES
Lab: 308 mg/dL
Lab: 466
Lab: 53
Lab: 81
Lab: 901

## 2019-02-01 LAB — CORTISOL 60 MINUTES POST: Lab: 16 ug/dL (ref 0–0.45)

## 2019-02-01 LAB — PHOSPHORUS: Lab: 3.9 mg/dL — ABNORMAL LOW (ref 2.0–4.5)

## 2019-02-01 LAB — PROTEIN/CR RATIO,UR RAN
Lab: 115 mg/dL
Lab: 5 mg/dL
Lab: 0

## 2019-02-01 LAB — CORTISOL 30 MINUTES POST: Lab: 13 ug/dL (ref 0–0.80)

## 2019-02-01 LAB — BASIC METABOLIC PANEL: Lab: 141 MMOL/L — ABNORMAL LOW (ref 137–147)

## 2019-02-01 LAB — CBC AND DIFF: Lab: 12 K/UL — ABNORMAL HIGH (ref 4.5–11.0)

## 2019-02-01 LAB — MAGNESIUM: Lab: 1.8 mg/dL — ABNORMAL LOW (ref 1.6–2.6)

## 2019-02-01 LAB — CORTISOL BASELINE: Lab: 0.7 ug/dL (ref 1.0–4.8)

## 2019-02-01 MED ORDER — PANTOPRAZOLE 40 MG PO TBEC
40 mg | Freq: Two times a day (BID) | ORAL | 0 refills | Status: DC
Start: 2019-02-01 — End: 2019-02-07
  Administered 2019-02-01 – 2019-02-06 (×11): 40 mg via ORAL

## 2019-02-01 MED ORDER — SODIUM CHLORIDE 0.9 % IJ SOLN
50 mL | Freq: Once | INTRAVENOUS | 0 refills | Status: CP
Start: 2019-02-01 — End: ?

## 2019-02-01 MED ORDER — TRIAMCINOLONE ACETONIDE 0.1 % TP CREA
Freq: Two times a day (BID) | TOPICAL | 0 refills | Status: DC
Start: 2019-02-01 — End: 2019-02-07
  Administered 2019-02-01 – 2019-02-06 (×2): via TOPICAL

## 2019-02-01 MED ORDER — AMOXICILLIN 500 MG PO CAP
1000 mg | Freq: Two times a day (BID) | ORAL | 0 refills | Status: DC
Start: 2019-02-01 — End: 2019-02-07
  Administered 2019-02-01 – 2019-02-06 (×11): 1000 mg via ORAL

## 2019-02-01 MED ORDER — GADOBENATE DIMEGLUMINE 529 MG/ML (0.1MMOL/0.2ML) IV SOLN
40 mL | Freq: Once | INTRAVENOUS | 0 refills | Status: CP
Start: 2019-02-01 — End: ?
  Administered 2019-02-01: 06:00:00 40 mL via INTRAVENOUS

## 2019-02-01 MED ORDER — CLARITHROMYCIN 500 MG PO TAB
500 mg | Freq: Two times a day (BID) | ORAL | 0 refills | Status: DC
Start: 2019-02-01 — End: 2019-02-07
  Administered 2019-02-01 – 2019-02-06 (×11): 500 mg via ORAL

## 2019-02-02 ENCOUNTER — Encounter: Admit: 2019-02-02 | Discharge: 2019-02-02 | Payer: MEDICAID | Primary: Adolescent Medicine

## 2019-02-02 ENCOUNTER — Inpatient Hospital Stay: Admit: 2019-02-02 | Discharge: 2019-02-02 | Payer: MEDICAID | Primary: Adolescent Medicine

## 2019-02-02 LAB — IMMUNOFIXATION, SERUM (IFES)

## 2019-02-02 LAB — PHOSPHORUS: Lab: 3.4 mg/dL (ref 2.0–4.5)

## 2019-02-02 LAB — CBC AND DIFF
Lab: 0.1 10*3/uL (ref 0–0.20)
Lab: 0.8 10*3/uL — ABNORMAL HIGH (ref 0–0.80)
Lab: 1 % (ref 0–2)
Lab: 14 % (ref >60)
Lab: 17 10*3/uL — ABNORMAL HIGH (ref 4.5–11.0)
Lab: 26 % (ref 24–44)
Lab: 28 pg (ref 26–34)
Lab: 321 K/UL (ref >60)
Lab: 33 % — ABNORMAL LOW (ref 41–77)
Lab: 35 % — ABNORMAL HIGH (ref 0–5)
Lab: 4.5 10*3/uL (ref 1.0–4.8)
Lab: 4.8 M/UL (ref 4.4–5.5)
Lab: 42 % (ref 40–50)
Lab: 5 % (ref 4–12)
Lab: 5.7 10*3/uL (ref 1.8–7.0)
Lab: 6 10*3/uL — ABNORMAL HIGH (ref 0–0.45)
Lab: 8 FL (ref 7–11)

## 2019-02-02 LAB — MANUAL DIFF
Lab: 22 % — ABNORMAL LOW (ref 24–44)
Lab: 31 % — ABNORMAL LOW (ref 41–77)
Lab: 4 % (ref 4–12)
Lab: 43 % — ABNORMAL HIGH (ref 0–5)

## 2019-02-02 LAB — T SPOT TB (QUANTIFERON TB)
Lab: 0
Lab: NEGATIVE

## 2019-02-02 LAB — CMV AB IGG: Lab: POSITIVE

## 2019-02-02 LAB — KAPPA/LAMBDA FREE LIGHT CHAINS
Lab: 1.2 mg/dL (ref 0.57–2.63)
Lab: 1.3 mg/dL (ref 0.33–1.94)

## 2019-02-02 LAB — BASIC METABOLIC PANEL
Lab: 137 MMOL/L (ref 137–147)
Lab: 5.2 MMOL/L — ABNORMAL HIGH (ref 3.5–5.1)

## 2019-02-02 LAB — CMV AB IGM: Lab: NEGATIVE

## 2019-02-02 LAB — TRYPTASE: Lab: 6 g/dL — ABNORMAL HIGH (ref >60)

## 2019-02-02 LAB — MAGNESIUM: Lab: 2 mg/dL (ref 1.6–2.6)

## 2019-02-02 MED ORDER — MORPHINE 4 MG/ML IV SYRG
4 mg | Freq: Once | INTRAVENOUS | 0 refills | Status: CP
Start: 2019-02-02 — End: ?
  Administered 2019-02-02: 19:00:00 4 mg via INTRAVENOUS

## 2019-02-02 MED ORDER — LACTATED RINGERS IV SOLP
INTRAVENOUS | 0 refills | Status: AC
Start: 2019-02-02 — End: ?
  Administered 2019-02-02 – 2019-02-04 (×4): 1000.000 mL via INTRAVENOUS

## 2019-02-02 MED ORDER — FENTANYL CITRATE (PF) 50 MCG/ML IJ SOLN
0 refills | Status: CP
Start: 2019-02-02 — End: ?
  Administered 2019-02-02 (×2): 50 ug via INTRAVENOUS

## 2019-02-02 MED ORDER — METRONIDAZOLE 500 MG PO TAB
500 mg | Freq: Two times a day (BID) | ORAL | 0 refills | Status: DC
Start: 2019-02-02 — End: 2019-02-07
  Administered 2019-02-02 – 2019-02-06 (×9): 500 mg via ORAL

## 2019-02-02 MED ORDER — MIDAZOLAM 1 MG/ML IJ SOLN
0 refills | Status: CP
Start: 2019-02-02 — End: ?
  Administered 2019-02-02: 13:00:00 1 mg via INTRAVENOUS

## 2019-02-02 MED ORDER — SODIUM CHLORIDE 0.9 % IV SOLP
0 refills | Status: CP
Start: 2019-02-02 — End: ?
  Administered 2019-02-02: 13:00:00 200 mL via INTRAVENOUS

## 2019-02-02 MED ORDER — MIDAZOLAM 1 MG/ML IJ SOLN
1-2 mg | Freq: Once | INTRAVENOUS | 0 refills | Status: CP
Start: 2019-02-02 — End: ?
  Administered 2019-02-02: 13:00:00 1.5 mg via INTRAVENOUS

## 2019-02-02 MED ORDER — MIDAZOLAM 1 MG/ML IJ SOLN
0 refills | Status: CP
Start: 2019-02-02 — End: ?
  Administered 2019-02-02: 13:00:00 0.5 mg via INTRAVENOUS

## 2019-02-03 LAB — CBC AND DIFF
Lab: 12 K/UL — ABNORMAL HIGH (ref 4.5–11.0)
Lab: 36 % — ABNORMAL LOW (ref 40–50)

## 2019-02-03 LAB — EBV DNA, PCR QUANT BLOOD

## 2019-02-03 LAB — CHLAM/NG PCR URINE: Lab: NEGATIVE

## 2019-02-03 LAB — OVA AND PARASITES, FECAL

## 2019-02-03 LAB — PHOSPHORUS: Lab: 3.9 mg/dL — ABNORMAL LOW (ref 2.0–4.5)

## 2019-02-03 LAB — ACTH: Lab: 15 pg/mL — ABNORMAL HIGH (ref 7–63)

## 2019-02-03 LAB — MAGNESIUM: Lab: 2 mg/dL — ABNORMAL LOW (ref 1.6–2.6)

## 2019-02-03 LAB — BASIC METABOLIC PANEL: Lab: 139 MMOL/L — ABNORMAL HIGH (ref >60)

## 2019-02-03 LAB — ADENOVIRUS QUANT PCR PLASMA

## 2019-02-03 MED ORDER — OXYCODONE 10 MG PO TAB
10 mg | Freq: Once | ORAL | 0 refills | Status: AC
Start: 2019-02-03 — End: ?

## 2019-02-04 LAB — GIARDIA SCREEN,FECAL: Lab: NEGATIVE

## 2019-02-04 LAB — CRYPTOSPORIDUM,FECAL

## 2019-02-04 LAB — MAGNESIUM: Lab: 2 mg/dL — ABNORMAL LOW (ref 1.6–2.6)

## 2019-02-04 LAB — CBC AND DIFF: Lab: 11 K/UL — ABNORMAL HIGH (ref 4.5–11.0)

## 2019-02-04 LAB — BASIC METABOLIC PANEL: Lab: 139 MMOL/L — ABNORMAL HIGH (ref >60)

## 2019-02-04 LAB — PHOSPHORUS: Lab: 3.3 mg/dL — ABNORMAL LOW (ref 2.0–4.5)

## 2019-02-05 ENCOUNTER — Encounter: Admit: 2019-02-05 | Discharge: 2019-02-05 | Primary: Adolescent Medicine

## 2019-02-05 LAB — BASIC METABOLIC PANEL: Lab: 140 MMOL/L — ABNORMAL LOW (ref 137–147)

## 2019-02-05 LAB — MAGNESIUM: Lab: 2 mg/dL — ABNORMAL LOW (ref >60)

## 2019-02-05 LAB — PHOSPHORUS: Lab: 4.4 mg/dL — ABNORMAL LOW (ref 2.0–4.5)

## 2019-02-05 LAB — STRONGYLOIDES AB IGG: Lab: 0.6 (ref 0.26–1.65)

## 2019-02-05 LAB — CBC AND DIFF
Lab: 12 g/dL — ABNORMAL LOW (ref >60)
Lab: 13 K/UL — ABNORMAL HIGH (ref 4.5–11.0)

## 2019-02-05 MED ORDER — ENOXAPARIN 40 MG/0.4 ML SC SYRG
40 mg | Freq: Every day | SUBCUTANEOUS | 0 refills | Status: DC
Start: 2019-02-05 — End: 2019-02-07
  Administered 2019-02-06: 02:00:00 40 mg via SUBCUTANEOUS

## 2019-02-05 MED ORDER — MEPOLIZUMAB 100 MG/ML SC ATIN
300 mg | SUBCUTANEOUS | 5 refills | 28.00000 days | Status: DC
Start: 2019-02-05 — End: 2019-07-28
  Filled 2019-02-16: qty 3, 30d supply, fill #1

## 2019-02-05 NOTE — Progress Notes
Sent Nucala script to [facility] to assess coverage.

## 2019-02-05 NOTE — Telephone Encounter
-----   Message from Radene Journey, MBBS sent at 02/02/2019  5:22 PM CDT -----  Hi ,This is a patient that we are currently seeing in the hospital.  He would like to start him on Nucala for a eGPA.  Can you please get the process going?  He will probably receive this as outpatient.  He will be following with Rodman Key, he also sees allergy/immunology.    Thanks!  Judson Roch

## 2019-02-05 NOTE — Progress Notes
The Prior Authorization for Nucala was submitted for Jonathon Simon via fax.  Will continue to follow.    [facility] Patient Advocate  [phone]

## 2019-02-06 ENCOUNTER — Encounter: Admit: 2019-02-06 | Discharge: 2019-02-06 | Primary: Adolescent Medicine

## 2019-02-06 LAB — T CELL GENE REARRANGEMENT BLOOD

## 2019-02-06 LAB — BASIC METABOLIC PANEL: Lab: 141 MMOL/L — ABNORMAL LOW (ref >60)

## 2019-02-06 LAB — PHOSPHORUS: Lab: 3.5 mg/dL — ABNORMAL LOW (ref >60)

## 2019-02-06 LAB — CBC AND DIFF: Lab: 12 K/UL — ABNORMAL HIGH (ref 4.5–11.0)

## 2019-02-06 LAB — MAGNESIUM: Lab: 2 mg/dL — ABNORMAL HIGH (ref >60)

## 2019-02-06 MED ORDER — AMOXICILLIN 500 MG PO CAP
1000 mg | ORAL_CAPSULE | Freq: Two times a day (BID) | ORAL | 0 refills | 7.00000 days | Status: AC
Start: 2019-02-06 — End: ?
  Filled 2019-02-06: qty 40, 10d supply, fill #1

## 2019-02-06 MED ORDER — INHALATIONAL SPACING DEVICE MISC SPCR
0 refills | Status: DC
Start: 2019-02-06 — End: 2019-02-06

## 2019-02-06 MED ORDER — LORAZEPAM 0.5 MG PO TAB
.5 mg | ORAL | 0 refills | Status: DC | PRN
Start: 2019-02-06 — End: 2019-02-06

## 2019-02-06 MED ORDER — CALCIUM CARBONATE 200 MG CALCIUM (500 MG) PO CHEW
500 mg | ORAL | 0 refills | 33.00000 days | Status: AC | PRN
Start: 2019-02-06 — End: ?

## 2019-02-06 MED ORDER — MIRTAZAPINE 15 MG PO TAB
15 mg | ORAL_TABLET | Freq: Every evening | ORAL | 1 refills | Status: AC
Start: 2019-02-06 — End: ?
  Filled 2019-02-06: qty 30, 30d supply, fill #1

## 2019-02-06 MED ORDER — ACETAMINOPHEN 325 MG PO TAB
650 mg | ORAL | 0 refills | Status: AC | PRN
Start: 2019-02-06 — End: ?

## 2019-02-06 MED ORDER — TRIAMCINOLONE ACETONIDE 0.1 % TP CREA
Freq: Two times a day (BID) | TOPICAL | 1 refills | Status: AC
Start: 2019-02-06 — End: ?
  Filled 2019-02-06: qty 80, 20d supply, fill #1

## 2019-02-06 MED ORDER — BUDESONIDE-FORMOTEROL 160-4.5 MCG/ACTUATION IN HFAA
2 | Freq: Two times a day (BID) | RESPIRATORY_TRACT | 1 refills | 30.00000 days | Status: AC
Start: 2019-02-06 — End: ?
  Filled 2019-02-06: qty 10, 30d supply, fill #1

## 2019-02-06 MED ORDER — POLYETHYLENE GLYCOL 3350 17 GRAM PO PWPK
17 g | Freq: Every day | ORAL | 1 refills | Status: CN
Start: 2019-02-06 — End: ?

## 2019-02-06 MED ORDER — PANTOPRAZOLE 40 MG PO TBEC
ORAL_TABLET | Freq: Two times a day (BID) | ORAL | 1 refills | 90.00000 days | Status: AC
Start: 2019-02-06 — End: ?
  Filled 2019-02-06: qty 60, 30d supply, fill #1

## 2019-02-06 MED ORDER — ERGOCALCIFEROL (VITAMIN D2) 1,250 MCG (50,000 UNIT) PO CAP
1 | ORAL_CAPSULE | ORAL | 3 refills | 56.00000 days | Status: AC
Start: 2019-02-06 — End: ?
  Filled 2019-02-06: qty 4, 28d supply, fill #1

## 2019-02-06 MED ORDER — DICYCLOMINE 10 MG PO CAP
10 mg | ORAL_CAPSULE | Freq: Before meals | ORAL | 1 refills | Status: AC
Start: 2019-02-06 — End: ?
  Filled 2019-02-06: qty 120, 30d supply, fill #1

## 2019-02-06 MED ORDER — MIRTAZAPINE 15 MG PO TAB
15 mg | Freq: Every evening | ORAL | 0 refills | Status: DC
Start: 2019-02-06 — End: 2019-02-07

## 2019-02-06 MED ORDER — INHALATIONAL SPACING DEVICE MISC SPCR
0 refills | Status: AC
Start: 2019-02-06 — End: ?
  Filled 2019-02-06: qty 1, 30d supply, fill #1

## 2019-02-06 MED ORDER — METRONIDAZOLE 500 MG PO TAB
500 mg | ORAL_TABLET | Freq: Two times a day (BID) | ORAL | 0 refills | Status: AC
Start: 2019-02-06 — End: ?
  Filled 2019-02-06: qty 20, 10d supply, fill #1

## 2019-02-06 MED ORDER — BUSPIRONE 5 MG PO TAB
5 mg | ORAL_TABLET | Freq: Two times a day (BID) | ORAL | 1 refills | Status: AC
Start: 2019-02-06 — End: ?
  Filled 2019-02-06: qty 60, 30d supply, fill #1

## 2019-02-06 MED ORDER — HYDROXYZINE HCL 25 MG PO TAB
25 mg | ORAL_TABLET | Freq: Three times a day (TID) | ORAL | 1 refills | 30.00000 days | Status: DC | PRN
Start: 2019-02-06 — End: 2019-05-08
  Filled 2019-02-06: qty 30, 10d supply, fill #1

## 2019-02-06 MED ORDER — CETIRIZINE 10 MG PO TAB
10 mg | ORAL_TABLET | Freq: Every morning | ORAL | 1 refills | Status: AC
Start: 2019-02-06 — End: ?
  Filled 2019-02-06: qty 30, 30d supply, fill #1

## 2019-02-06 MED ORDER — OXYCODONE 5 MG PO TAB
5-10 mg | ORAL_TABLET | ORAL | 0 refills | 6.00000 days | Status: DC | PRN
Start: 2019-02-06 — End: 2019-05-08
  Filled 2019-02-06: qty 40, 5d supply, fill #1

## 2019-02-06 MED ORDER — EPINEPHRINE 0.3 MG/0.3 ML IJ ATIN
INTRAMUSCULAR | 1 refills | 30.00000 days | Status: AC
Start: 2019-02-06 — End: ?
  Filled 2019-02-06: qty 2, 2d supply, fill #1

## 2019-02-06 MED ORDER — ALBUTEROL SULFATE 90 MCG/ACTUATION IN HFAA
2 | RESPIRATORY_TRACT | 1 refills | Status: AC | PRN
Start: 2019-02-06 — End: ?
  Filled 2019-02-06: qty 8, 17d supply, fill #1

## 2019-02-06 MED ORDER — MONTELUKAST 10 MG PO TAB
10 mg | ORAL_TABLET | Freq: Every evening | ORAL | 1 refills | 90.00000 days | Status: AC
Start: 2019-02-06 — End: ?
  Filled 2019-02-06: qty 30, 30d supply, fill #1

## 2019-02-06 MED ORDER — BUSPIRONE 5 MG PO TAB
5 mg | Freq: Two times a day (BID) | ORAL | 0 refills | Status: DC
Start: 2019-02-06 — End: 2019-02-07
  Administered 2019-02-06: 19:00:00 5 mg via ORAL

## 2019-02-06 MED ORDER — CLARITHROMYCIN 500 MG PO TAB
500 mg | ORAL_TABLET | Freq: Two times a day (BID) | ORAL | 0 refills | Status: AC
Start: 2019-02-06 — End: ?
  Filled 2019-02-06: qty 20, 10d supply, fill #1

## 2019-02-06 MED ORDER — SENNOSIDES-DOCUSATE SODIUM 8.6-50 MG PO TAB
2 | ORAL_TABLET | Freq: Two times a day (BID) | ORAL | 1 refills | Status: AC
Start: 2019-02-06 — End: ?
  Filled 2019-02-06: qty 60, 15d supply, fill #1

## 2019-02-06 MED ORDER — FLUTICASONE PROPIONATE 50 MCG/ACTUATION NA SPSN
2 | Freq: Every day | NASAL | 1 refills | 60.00000 days | Status: AC
Start: 2019-02-06 — End: ?
  Filled 2019-02-06: qty 16, 30d supply, fill #1

## 2019-02-07 ENCOUNTER — Encounter: Admit: 2019-02-07 | Discharge: 2019-02-07 | Primary: Adolescent Medicine

## 2019-02-07 LAB — COXSACKIE A 9

## 2019-02-07 NOTE — Telephone Encounter
[facility] Intake Assessment Document    Patient Name:  Jonathon Simon  DOB:  06-07-1995  Insurance:   [employer]     Appointment Info:    Future Appointments   Date Time Provider [place]   02/09/2019 10:00 AM Remus Blake, MD [place] [facility]   02/21/2019  2:30 PM Clint Bolder, DO [facility] IM   03/23/2019 10:00 AM Craige Cotta, MD [facility]   05/15/2019  4:00 PM Caffie Pinto, MD [facility] IM   05/22/2019  1:00 PM Kinnie Feil, MD [facility] IM     Diagnosis & Reason for Visit:  Here to review BMBx results     Physician Info:   Referring Physician:  Constance Haw APRN ([place] inpatient APRN)     Location of Films:  IN HOUSE    Location of Pathology:  IN HOUSE     Comments:  COVID-19 guidelines reviewed with patient, including: visitor and universal masking policies, and a temperature check at the facility entrance upon arrival.

## 2019-02-08 ENCOUNTER — Encounter: Admit: 2019-02-08 | Discharge: 2019-02-08 | Primary: Adolescent Medicine

## 2019-02-08 NOTE — Telephone Encounter
Per Dr Athena Masse Berton Bon appt was pushed back to 10/30 so that NGS results will hopefully be back. I called [facility] and spoke to his mother who reported they are fine with this change.

## 2019-02-09 ENCOUNTER — Encounter: Admit: 2019-02-09 | Discharge: 2019-02-09 | Primary: Adolescent Medicine

## 2019-02-09 NOTE — Progress Notes
Baseline labs needed prior to starting specialty medication mepolizumab (Nucala).    Labs needed: infectious disease labs from the hospital   Communication with Physician/APP: Physician/APP aware  Plan for obtaining labs: labs in progress, do not ship medication until labs are back and negative

## 2019-02-09 NOTE — Progress Notes
The Prior Authorization for Nucala was approved for Jonathon Simon from 02/07/19 to 02/07/20.  The copay is $0.       [facility] Patient Advocate  [phone]

## 2019-02-09 NOTE — Telephone Encounter
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     Spoke with pt over the phone today. Pt reports following inpatient recommendations of dairy/gluten free diet after discharge with slight improvement in GI symptoms.Continuing to drink [employer] based drink as he was able to find these in store; [employer] to arrive tonight. Reviewed with pt how to order additional [employer] drinks if needed. Pt inquired about whey, RD clarified that is milk based and was to be avoided.    RD working to determine if any of his upcoming appointments align with any clinic RDs to continue to follow his progress.    Goal: Continue to follow dairy/gluten free diet. Supplement with compliant protein drink as needed.    Deeann Cree, MS, RD, LD   Desk phone [phone] - Available on Voalte

## 2019-02-09 NOTE — Progress Notes
Rheumatology Clinic Pharmacist Medication Education    Comfort level with therapy on a scale of 1 (not comfortable at all) to 5 (very comfortable) prior to education: 3    Provided education on mepolizumab (Nucala), including:  -purpose, expected benefit  -dosing: 300mg  SC every 30 days   -side effects   -most common: HA, injection-site reaction   -rare: anaphylaxis,   -infection prevention   -staying up to date with inactivated vaccinations, avoid live vaccinations   -contact clinic during significant illness (fever or antibiotic use)   -inform clinic if any procedures are scheduled  -monitoring    -none for safety      Baseline labs have been collected and evaluated.     The patient's ability to self-administer medication was assessed. The patient was educated on proper dosing and administration technique for their therapy as well as the planned duration of therapy. Appropriate self-handling, storage and disposal directions were reviewed with the patient.     Contraindications to therapy, safety precautions, and common side effects were discussed with the patient. They were instructed to seek medical attention immediately if they experience signs of an allergic reaction, including but not limited to: a rash; hives; itching; red, swollen, blistered, or peeling skin with or without fever.    Requirements of the REMS program were discussed with the patient as applicable.     Appropriate recommended vaccinations were reviewed and discussed with the patient.    A medication history and reconciliation was performed (including prescription medications, supplements, over the counter, and herbal products). The medication list was updated and the patients current medication list is listed below.     Home Medications    Medication Sig   acetaminophen (TYLENOL) 325 mg tablet Take two tablets by mouth every 4 hours as needed.   albuterol sulfate (PROAIR HFA) 90 mcg/actuation HFA aerosol inhaler Inhale two puffs by mouth into the lungs every 4 hours as needed for Wheezing or Shortness of Breath. Shake well before use.   amoxicillin (AMOXIL) 500 mg capsule Take two capsules by mouth twice daily for 10 days.   budesonide-formoterol (SYMBICORT HFA) 160-4.5 mcg/actuation inhalation Inhale two puffs by mouth into the lungs twice daily.   busPIRone (BUSPAR) 5 mg tablet Take one tablet by mouth twice daily.   calcium carbonate (TUMS) 500 mg (200 mg elemental calcium) chewable tablet Chew one tablet by mouth every 4 hours as needed.   cetirizine (ZYRTEC) 10 mg tablet Take one tablet by mouth every morning.   clarithromycin (BIAXIN) 500 mg tablet Take one tablet by mouth twice daily for 10 days.   dicyclomine (BENTYL) 10 mg capsule Take one capsule by mouth before meals and at bedtime.   EPINEPHrine (EPIPEN) 1 mg/mL injection pen (2-Pack) Inject 0.3 mg (1 Pen) into thigh if needed for anaphylactic reaction. May repeat in 5-15 minutes if needed.   ergocalciferol (VITAMIN D-2) 1,250 mcg (50,000 unit) capsule Take one capsule by mouth every 7 days.   fluticasone propionate (FLONASE) 50 mcg/actuation nasal spray, suspension Apply two sprays to each nostril as directed daily. Shake bottle gently before using.   hydrOXYzine (ATARAX) 25 mg tablet Take one tablet by mouth three times daily as needed.   mepolizumab 100 mg/mL atIn Inject 300 mg under the skin every 30 days.   metroNIDAZOLE (FLAGYL) 500 mg tablet Take one tablet by mouth twice daily for 10 days. Take with food. Do not drink alcohol while on metronidazole.   mirtazapine (REMERON) 15 mg tablet Take one  tablet by mouth at bedtime daily.   montelukast (SINGULAIR) 10 mg tablet Take one tablet by mouth at bedtime daily.   oxyCODONE (ROXICODONE) 5 mg tablet Take one tablet to two tablets by mouth every 6 hours as needed   pantoprazole DR (PROTONIX) 40 mg tablet Take 1 tablet by mouth twice daily until 10/30, then reduce to once daily and continue indefinitely. polyethylene glycol 3350 (MIRALAX) 17 g packet Take one packet by mouth daily.   senna/docusate (SENOKOT-S) 8.6/50 mg tablet Take two tablets by mouth twice daily.   spacer inhalation device (AEROCHAMBER) spcr use as directed with inhaler   triamcinolone acetonide (KENALOG) 0.1 % topical cream Apply  topically to affected area twice daily.        Drug-drug and drug-food interactions with the new therapy were assessed and reviewed with the patient. The patient was instructed to speak with their health care provider before starting any new drug, including prescription or over the counter, natural products, or vitamins.    Pregnancy potential was reviewed with the patient. Male: education not applicable.    The monitoring and follow-up plan was discussed with the patient. The patient was instructed to contact their health care provider if their symptoms or health problems do not get better or if they become worse. Jonathon Simon was instructed to contact pharmacist at [phone] if they have any questions or concerns regarding their medication therapy.     Patient fills at the [facility] pharmacy.    Comfort level with therapy on a scale of 1 (not comfortable at all) to 5 (very comfortable) after education: 4    Additional discussions: Patient wanted to make sure all labs from the hospital were back and he was given the OK to start the medication.  Will touch base with Dr Renee Rival and call patient back on Monday 10/26 prior to shipping out the medication        Jonathon Simon was given the opportunity to ask questions and did not have any questions at this time.    Elnita Maxwell, [facility]

## 2019-02-09 NOTE — Progress Notes
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      Prescribed maintenance regimen: 300mg  SC every 30 days. Plan to continue indefinitely. The medication(s) have been appropriately dosed based on the patients renal and/or hepatic function.    At this time there are no planned titration/loading doses    The patient has the ability to self-administer the medication.     Therapeutic goals:reduce pain and improve quality of life    Patient fills at the [facility] pharmacy.    Baseline Characteristics   Level of disease activity: not assessed at last office visit   Current DMARDs:Nucala   Previous DMARDs:None   Flare in the last 30 days: Yes   Additional considerations/co-morbidities: none    TB Screening  T Spot TB   Date Value Ref Range Status   01/31/2019   Final    Negative  Reference range: Normal Value: Negative  A negative test result does not exclude the possibility of exposure  to or infection with Mycobacterium tuberculosis (M. tuberculosis).   Patients with recent exposure to TB infected individuals exhibiting a  negative T-SPOT.TB result should be considered for retesting within 6  weeks or if other relevant clinical symptoms indicate.  Results from  T-SPOT.TB testing must be used in conjunction with each individual's  epidemiological history, current medical status, and results of other  diagnostic evaluations.[id number] T-SPOT.TB test is qualitative  and results are reported as positive, borderline or negative, given  that the test controls perform as expected. In line with the Centers  for Disease Control and Prevention's 2010 recommendation to report  quantitative measurements alongside the qualitative result, the  laboratory provides spot counts for informational purposes only.  The T-SPOT.TB test should not be interpreted as a quantitative test.         Hepatitis B Screening  HBsAg   Date/Time Value Ref Range Status   01/31/2019 05:26 AM Non-Reactive: HBs antigen not detected NRHB-Non-Reactive: HBs antigen not detected Final     No results found for: HEPBCTOTAL  Anti HBs   Date/Time Value Ref Range Status   01/31/2019 05:26 AM (A) NHBV-Negative: Individual is considered to be non-immune to Final    Positive: Anti-HBs concentration detected at >10 mIU/mL,indicating recovery from   HBV infection or acquired immunity from HBV vaccination.          Other Pertinent Labs   NA    Baseline labs have been collected and evaluated.     Allergies   Allergies   Allergen Reactions   ? Iodine ANAPHYLAXIS   ? Peanut ANAPHYLAXIS   ? Tylenol [Acetaminophen] NAUSEA ONLY        Vaccination Status    There is no immunization history on file for this patient.  Vaccine history will be reviewed with patient. Patient will be reminded about the importance of receiving an annual influenza vaccine as well as the pneumococcal and shingles vaccines if indicated.    Pregnancy Status  Pregnancy status was assessed and determined to be: Male: education not applicable.    Past Medical History  Medical History:   Diagnosis Date   ? Anxiety    ? Asthma    ? GERD (gastroesophageal reflux disease)    ? Nasal polyps    ? No blood products     Pt is Jehovah's Witness, does not want any blood products.    ?  Rhinitis    ? Sinusitis        Medication Reconciliation  Medication reconciliation is based on the patient?s most recent med list in electronic medical record (EMR) including herbal products and OTC medications. The patients? medication list will be updated during patient education, after speaking with the patient and prior to dispensing the medication.     Home Medications    Medication Sig   acetaminophen (TYLENOL) 325 mg tablet Take two tablets by mouth every 4 hours as needed. albuterol sulfate (PROAIR HFA) 90 mcg/actuation HFA aerosol inhaler Inhale two puffs by mouth into the lungs every 4 hours as needed for Wheezing or Shortness of Breath. Shake well before use.   amoxicillin (AMOXIL) 500 mg capsule Take two capsules by mouth twice daily for 10 days.   budesonide-formoterol (SYMBICORT HFA) 160-4.5 mcg/actuation inhalation Inhale two puffs by mouth into the lungs twice daily.   busPIRone (BUSPAR) 5 mg tablet Take one tablet by mouth twice daily.   calcium carbonate (TUMS) 500 mg (200 mg elemental calcium) chewable tablet Chew one tablet by mouth every 4 hours as needed.   cetirizine (ZYRTEC) 10 mg tablet Take one tablet by mouth every morning.   clarithromycin (BIAXIN) 500 mg tablet Take one tablet by mouth twice daily for 10 days.   dicyclomine (BENTYL) 10 mg capsule Take one capsule by mouth before meals and at bedtime.   EPINEPHrine (EPIPEN) 1 mg/mL injection pen (2-Pack) Inject 0.3 mg (1 Pen) into thigh if needed for anaphylactic reaction. May repeat in 5-15 minutes if needed.   ergocalciferol (VITAMIN D-2) 1,250 mcg (50,000 unit) capsule Take one capsule by mouth every 7 days.   fluticasone propionate (FLONASE) 50 mcg/actuation nasal spray, suspension Apply two sprays to each nostril as directed daily. Shake bottle gently before using.   hydrOXYzine (ATARAX) 25 mg tablet Take one tablet by mouth three times daily as needed.   mepolizumab 100 mg/mL atIn Inject 300 mg under the skin every 30 days.   metroNIDAZOLE (FLAGYL) 500 mg tablet Take one tablet by mouth twice daily for 10 days. Take with food. Do not drink alcohol while on metronidazole.   mirtazapine (REMERON) 15 mg tablet Take one tablet by mouth at bedtime daily.   montelukast (SINGULAIR) 10 mg tablet Take one tablet by mouth at bedtime daily.   oxyCODONE (ROXICODONE) 5 mg tablet Take one tablet to two tablets by mouth every 6 hours as needed   pantoprazole DR (PROTONIX) 40 mg tablet Take 1 tablet by mouth twice daily until 10/30, then reduce to once daily and continue indefinitely.   polyethylene glycol 3350 (MIRALAX) 17 g packet Take one packet by mouth daily.   senna/docusate (SENOKOT-S) 8.6/50 mg tablet Take two tablets by mouth twice daily.   spacer inhalation device (AEROCHAMBER) spcr use as directed with inhaler   triamcinolone acetonide (KENALOG) 0.1 % topical cream Apply  topically to affected area twice daily.       Drug-Drug Interactions  No new significant drug-drug or drug-food interactions were identified.    Drug-Food Interactions  There are no significant drug-food interactions. This medication can be taken with or without food.     Contraindications  Ishmail Jacqualyn Posey has no contraindications to this medication.    Safety Precautions  Safety precautions were evaluated and discussed with patient as applicable.     Risk Evaluation and Mitigation Strategy (REMS) Assessment  No REMS are required for any of the prescribed medications for this patient.  Medication Education  Patient will be contacted to complete education on therapy.      Follow-up Plan   -Initial therapy assessment has been completed  -Patient will be reassessed within 3 months.      Elnita Maxwell, [facility]

## 2019-02-11 LAB — ECHOVIRUS AB PANEL
Lab: 1:2 {titer}
Lab: 1:8 {titer}

## 2019-02-11 LAB — COXSACKIE B 1-6: Lab: 1:1 {titer}

## 2019-02-15 ENCOUNTER — Encounter: Admit: 2019-02-15 | Discharge: 2019-02-15 | Primary: Adolescent Medicine

## 2019-02-16 ENCOUNTER — Encounter: Admit: 2019-02-16 | Discharge: 2019-02-16 | Primary: Adolescent Medicine

## 2019-02-16 NOTE — Telephone Encounter
Per Dr Athena Masse I called and let Jonathon Simon know his NGS is still not resulted and Dr Athena Masse would like to delay his appt until next Friday. Darrius Estill Simon was agreeable to this plan and appt was moved to next Friday at 1pm. I reviewed address and appt instructions again w/ pt as he has several up coming appts in the health system. He also reported he has been instructed to start Nucala by Rhuematology and ID, I sent Dr Athena Masse and Tonette Bihari RN an email letting him know this information.

## 2019-02-21 ENCOUNTER — Encounter: Admit: 2019-02-21 | Discharge: 2019-02-21 | Primary: Adolescent Medicine

## 2019-02-21 NOTE — Progress Notes
Rihaan Daigler called and had a question about his mepolizumab (Nucala) injection.  He states he was only able to inject a partial dose on 2 of his 3 mepolizumab (Nucala) pens. He was not aware he was supposed to continue to hold the Autoinjector device down after hearing the first click.  Since we are unable to assess exactly how much medication he was able to inject, will attempt to ship his next months dose a week early.  We will schedule an in-person injection teach for his next months injection, so we can go over how to properly inject the mepolizumab (Nucala).    Will plan to ship out next month of mepolizumab (Nucala) around 11/13 and schedule in person injection teach for the week of 11/16-11/20.    [place] also inquired about cost of medication with his insurance.  He stated that he received a call from his insurance telling him he needed to see a PCP in [place], as having a PCP in [place] was out of network.  Told him this would not affect his medication cost, as this has been approved for $0 copay.   Advised he call insurance to clarify if specialists at [facility] (rheum, allergy) would still be covered.

## 2019-02-26 ENCOUNTER — Encounter: Admit: 2019-02-26 | Discharge: 2019-02-26 | Primary: Adolescent Medicine

## 2019-02-26 NOTE — Telephone Encounter
[facility] SELF PAY  Patient NOT Financially Cleared for Treatment/Services  Patient provided all options for care and has decided to not proceed with care at [facility] at this time  Financial Advisor contact: Tanya Nones [phone]    Patient has been advised that the CC is indeed OON and he does not have any oon benefits and would be self-pay.he does not have the funds to pay oop

## 2019-03-01 ENCOUNTER — Encounter: Admit: 2019-03-01 | Discharge: 2019-03-01 | Primary: Adolescent Medicine

## 2019-03-02 ENCOUNTER — Encounter: Admit: 2019-03-02 | Discharge: 2019-03-02 | Primary: Adolescent Medicine

## 2019-03-02 MED FILL — MEPOLIZUMAB 100 MG/ML SC ATIN: 100 mg/mL | SUBCUTANEOUS | 30 days supply | Qty: 3 | Fill #2 | Status: AC

## 2019-03-06 ENCOUNTER — Encounter: Admit: 2019-03-06 | Discharge: 2019-03-06 | Primary: Adolescent Medicine

## 2019-03-23 ENCOUNTER — Encounter: Admit: 2019-03-23 | Discharge: 2019-03-23 | Primary: Adolescent Medicine

## 2019-03-23 NOTE — Telephone Encounter
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 He is out of network with [facility]. I saw him while he was hospitalized. He was supposed to have an appointment with me, but I am cancelling it because he would have to pay out of pocket.     Angie,   Can you send a referral to [facility] as he would need to be seen by a rheumatologist in [place]? This would be a good one for Dr. Delilah Shan.     Eliezer Lofts and Kathlee Nations,   He has not taken his Nucala since the second one. He was supposed to come in for an in person teaching appointment. He is out of network so would that cost him anything? If it does not cost anything, could you call him to find a time for him to come in? I told him I would prescribe the Nucala for him until he can get into [facility].     Thanks everyone for your help with this.     Rodman Key

## 2019-03-23 NOTE — Telephone Encounter
Called patient. Scheduled Nucala injection teach visit at the [facility] on 12/7 at 10am.

## 2019-03-26 ENCOUNTER — Encounter: Admit: 2019-03-26 | Discharge: 2019-03-26 | Primary: Adolescent Medicine

## 2019-03-26 ENCOUNTER — Ambulatory Visit: Admit: 2019-03-26 | Discharge: 2019-03-26 | Payer: MEDICAID | Primary: Adolescent Medicine

## 2019-03-26 NOTE — Progress Notes
Rheumatology Clinic Pharmacist Injection Education    Patient presents today for teaching on subcutaneous mepolizumab (Nucala).    Current dose is 300mg  SC monthly    Patient was educated on proper administration technique as well as storage/disposal. The patient demonstrated correct technique after instruction.     The patient successfully injected 300mg  into the right thigh and lower right abdomen. Next dose due on 04/26/19.    The monitoring and follow-up plan was discussed with the patient. The patient was instructed to contact their health care provider if their symptoms or health problems do not get better or if they become worse. Jonathon Simon was instructed to contact pharmacist at [phone] if they have any questions or concerns regarding their medication therapy.       Additional discussions: None      Jonathon Simon was given the opportunity to ask questions and did not have any questions at this time.    Jeneen Rinks, PHARMD

## 2019-03-30 ENCOUNTER — Encounter: Admit: 2019-03-30 | Discharge: 2019-03-30 | Primary: Adolescent Medicine

## 2019-03-30 DIAGNOSIS — M301 Polyarteritis with lung involvement [Churg-Strauss]: Secondary | ICD-10-CM

## 2019-03-30 NOTE — Telephone Encounter
[facility]ax[facility] [phone].

## 2019-03-30 NOTE — Telephone Encounter
Angie,   Can you send a referral to [facility] rheumatology as he would need to be seen by a rheumatologist in [place]? This would be a good one for Dr. Delilah Shan.

## 2019-04-23 ENCOUNTER — Encounter: Admit: 2019-04-23 | Discharge: 2019-04-23 | Primary: Adolescent Medicine

## 2019-04-23 MED FILL — MEPOLIZUMAB 100 MG/ML SC ATIN: 100 mg/mL | SUBCUTANEOUS | 30 days supply | Qty: 3 | Fill #3 | Status: AC

## 2019-05-06 ENCOUNTER — Encounter: Admit: 2019-05-06 | Discharge: 2019-05-06 | Primary: Adolescent Medicine

## 2019-05-06 ENCOUNTER — Emergency Department: Admit: 2019-05-06 | Discharge: 2019-05-06

## 2019-05-06 DIAGNOSIS — J45909 Unspecified asthma, uncomplicated: Secondary | ICD-10-CM

## 2019-05-06 DIAGNOSIS — Z789 Other specified health status: Secondary | ICD-10-CM

## 2019-05-06 DIAGNOSIS — J329 Chronic sinusitis, unspecified: Secondary | ICD-10-CM

## 2019-05-06 DIAGNOSIS — J339 Nasal polyp, unspecified: Secondary | ICD-10-CM

## 2019-05-06 DIAGNOSIS — K219 Gastro-esophageal reflux disease without esophagitis: Secondary | ICD-10-CM

## 2019-05-06 DIAGNOSIS — J310 Chronic rhinitis: Secondary | ICD-10-CM

## 2019-05-06 DIAGNOSIS — F419 Anxiety disorder, unspecified: Secondary | ICD-10-CM

## 2019-05-06 MED ORDER — FENTANYL CITRATE (PF) 50 MCG/ML IJ SOLN
50 ug | Freq: Once | INTRAVENOUS | 0 refills | Status: CP
Start: 2019-05-06 — End: ?
  Administered 2019-05-07: 05:00:00 50 ug via INTRAVENOUS

## 2019-05-07 ENCOUNTER — Emergency Department: Admit: 2019-05-07 | Discharge: 2019-05-06 | Payer: MEDICAID

## 2019-05-07 LAB — CBC AND DIFF
Lab: 0 10*3/uL (ref 0–0.20)
Lab: 0.1 10*3/uL (ref 0–0.45)
Lab: 32 g/dL (ref 32.0–36.0)
Lab: 51 % (ref 41–77)
Lab: 9 10*3/uL (ref 4.5–11.0)
Lab: 17 (ref <20.7)

## 2019-05-07 LAB — COMPREHENSIVE METABOLIC PANEL
Lab: 0.2 mg/dL — ABNORMAL LOW (ref 0.3–1.2)
Lab: 11 U/L (ref 7–56)
Lab: 13 U/L (ref 7–40)
Lab: 140 MMOL/L — ABNORMAL LOW (ref 137–147)
Lab: 18 mg/dL (ref 7–25)
Lab: 26 MMOL/L (ref 21–30)
Lab: 6.6 g/dL (ref 6.0–8.0)
Lab: 69 U/L (ref 25–110)
Lab: 7 10*3/uL (ref 3–12)
Lab: 89 mg/dL (ref 70–100)
Lab: 9.4 mg/dL (ref 8.5–10.6)
Lab: >60 mL/min (ref >60)
Lab: >60 mL/min (ref >60)

## 2019-05-07 LAB — PTT (APTT): Lab: 34 s — ABNORMAL LOW (ref 24.0–36.5)

## 2019-05-07 LAB — POC LACTATE: Lab: 0.8 MMOL/L (ref 0.5–2.0)

## 2019-05-07 LAB — PROTIME INR (PT): Lab: 1 MMOL/L — ABNORMAL LOW (ref 0.8–1.2)

## 2019-05-07 LAB — POC TROPONIN: Lab: 0 ng/mL (ref 0.00–0.05)

## 2019-05-07 MED ORDER — BUSPIRONE 5 MG PO TAB
5 mg | Freq: Two times a day (BID) | ORAL | 0 refills | Status: DC
Start: 2019-05-07 — End: 2019-05-09
  Administered 2019-05-07 – 2019-05-09 (×5): 5 mg via ORAL

## 2019-05-07 MED ORDER — CETIRIZINE 10 MG PO TAB
10 mg | Freq: Every morning | ORAL | 0 refills | Status: DC
Start: 2019-05-07 — End: 2019-05-09
  Administered 2019-05-07 – 2019-05-09 (×3): 10 mg via ORAL

## 2019-05-07 MED ORDER — HYDROXYZINE HCL 50 MG PO TAB
25 mg | Freq: Three times a day (TID) | ORAL | 0 refills | Status: DC | PRN
Start: 2019-05-07 — End: 2019-05-08
  Administered 2019-05-07: 12:00:00 25 mg via ORAL

## 2019-05-07 MED ORDER — ACETAMINOPHEN 325 MG PO TAB
650 mg | ORAL | 0 refills | Status: DC | PRN
Start: 2019-05-07 — End: 2019-05-09

## 2019-05-07 MED ORDER — TRIAMCINOLONE ACETONIDE 0.1 % TP CREA
Freq: Two times a day (BID) | TOPICAL | 0 refills | Status: DC
Start: 2019-05-07 — End: 2019-05-09
  Administered 2019-05-07: 14:00:00 via TOPICAL

## 2019-05-07 MED ORDER — ALBUTEROL SULFATE 90 MCG/ACTUATION IN HFAA
2 | RESPIRATORY_TRACT | 0 refills | Status: DC | PRN
Start: 2019-05-07 — End: 2019-05-09

## 2019-05-07 MED ORDER — OXYCODONE 5 MG PO TAB
5-10 mg | ORAL | 0 refills | Status: DC | PRN
Start: 2019-05-07 — End: 2019-05-09
  Administered 2019-05-07 – 2019-05-09 (×10): 10 mg via ORAL

## 2019-05-07 MED ORDER — SENNOSIDES-DOCUSATE SODIUM 8.6-50 MG PO TAB
2 | Freq: Two times a day (BID) | ORAL | 0 refills | Status: DC
Start: 2019-05-07 — End: 2019-05-09
  Administered 2019-05-07 – 2019-05-09 (×5): 2 via ORAL

## 2019-05-07 MED ORDER — ONDANSETRON HCL (PF) 4 MG/2 ML IJ SOLN
4 mg | INTRAVENOUS | 0 refills | Status: DC | PRN
Start: 2019-05-07 — End: 2019-05-09

## 2019-05-07 MED ORDER — POLYETHYLENE GLYCOL 3350 17 GRAM PO PWPK
17 g | Freq: Every day | ORAL | 0 refills | Status: DC
Start: 2019-05-07 — End: 2019-05-09
  Administered 2019-05-09: 17:00:00 17 g via ORAL

## 2019-05-07 MED ORDER — MONTELUKAST 10 MG PO TAB
10 mg | Freq: Every evening | ORAL | 0 refills | Status: DC
Start: 2019-05-07 — End: 2019-05-09
  Administered 2019-05-08 – 2019-05-09 (×2): 10 mg via ORAL

## 2019-05-07 MED ORDER — MIRTAZAPINE 15 MG PO TAB
15 mg | Freq: Every evening | ORAL | 0 refills | Status: DC
Start: 2019-05-07 — End: 2019-05-09
  Administered 2019-05-08 – 2019-05-09 (×2): 15 mg via ORAL

## 2019-05-07 MED ORDER — DICYCLOMINE 10 MG PO CAP
10 mg | Freq: Before meals | ORAL | 0 refills | Status: DC
Start: 2019-05-07 — End: 2019-05-09
  Administered 2019-05-07 – 2019-05-09 (×9): 10 mg via ORAL

## 2019-05-07 MED ORDER — OXYCODONE 5 MG PO TAB
5-10 mg | ORAL | 0 refills | Status: DC | PRN
Start: 2019-05-07 — End: 2019-05-07
  Administered 2019-05-07: 09:00:00 10 mg via ORAL

## 2019-05-07 MED ORDER — PANTOPRAZOLE 40 MG PO TBEC
40 mg | Freq: Two times a day (BID) | ORAL | 0 refills | Status: DC
Start: 2019-05-07 — End: 2019-05-09
  Administered 2019-05-08 – 2019-05-09 (×4): 40 mg via ORAL

## 2019-05-07 MED ORDER — HYOSCYAMINE SULFATE 0.125 MG PO TBDI
.125 mg | SUBLINGUAL | 0 refills | Status: DC | PRN
Start: 2019-05-07 — End: 2019-05-09
  Administered 2019-05-07 – 2019-05-09 (×7): 0.125 mg via SUBLINGUAL

## 2019-05-07 MED ORDER — CALCIUM CARBONATE 200 MG CALCIUM (500 MG) PO CHEW
500 mg | ORAL | 0 refills | Status: DC | PRN
Start: 2019-05-07 — End: 2019-05-09

## 2019-05-07 MED ORDER — FLUTICASONE PROPIONATE 50 MCG/ACTUATION NA SPSN
2 | Freq: Every day | NASAL | 0 refills | Status: DC
Start: 2019-05-07 — End: 2019-05-09
  Administered 2019-05-07: 14:00:00 2 via NASAL

## 2019-05-07 MED ORDER — OXYBUTYNIN CHLORIDE 5 MG PO TAB
5 mg | Freq: Three times a day (TID) | ORAL | 0 refills | Status: DC | PRN
Start: 2019-05-07 — End: 2019-05-09
  Administered 2019-05-07 – 2019-05-09 (×5): 5 mg via ORAL

## 2019-05-07 MED ORDER — BUDESONIDE-FORMOTEROL 160-4.5 MCG/ACTUATION IN HFAA
2 | Freq: Two times a day (BID) | RESPIRATORY_TRACT | 0 refills | Status: DC
Start: 2019-05-07 — End: 2019-05-09
  Administered 2019-05-07: 12:00:00 2 via RESPIRATORY_TRACT

## 2019-05-07 MED ORDER — ONDANSETRON HCL 4 MG PO TAB
4 mg | ORAL | 0 refills | Status: DC | PRN
Start: 2019-05-07 — End: 2019-05-09

## 2019-05-08 ENCOUNTER — Encounter: Admit: 2019-05-08 | Discharge: 2019-05-08 | Primary: Adolescent Medicine

## 2019-05-08 DIAGNOSIS — J339 Nasal polyp, unspecified: Secondary | ICD-10-CM

## 2019-05-08 DIAGNOSIS — J329 Chronic sinusitis, unspecified: Secondary | ICD-10-CM

## 2019-05-08 DIAGNOSIS — Z789 Other specified health status: Secondary | ICD-10-CM

## 2019-05-08 DIAGNOSIS — F419 Anxiety disorder, unspecified: Secondary | ICD-10-CM

## 2019-05-08 DIAGNOSIS — J45909 Unspecified asthma, uncomplicated: Secondary | ICD-10-CM

## 2019-05-08 DIAGNOSIS — K219 Gastro-esophageal reflux disease without esophagitis: Secondary | ICD-10-CM

## 2019-05-08 DIAGNOSIS — J310 Chronic rhinitis: Secondary | ICD-10-CM

## 2019-05-08 MED ORDER — FERROUS GLUCONATE 324 MG (37.5 MG IRON) PO TAB
324 mg | ORAL_TABLET | Freq: Every day | ORAL | 0 refills | Status: CN
Start: 2019-05-08 — End: ?

## 2019-05-08 MED ORDER — OXYBUTYNIN CHLORIDE 5 MG PO TAB
5 mg | ORAL_TABLET | Freq: Three times a day (TID) | ORAL | 0 refills | 12.00000 days | Status: AC | PRN
Start: 2019-05-08 — End: ?
  Filled 2019-05-09: qty 270, 30d supply, fill #1

## 2019-05-08 MED ORDER — LORAZEPAM 2 MG/ML IJ SOLN
.5 mg | Freq: Once | INTRAVENOUS | 0 refills | Status: CP
Start: 2019-05-08 — End: ?
  Administered 2019-05-09: 04:00:00 0.5 mg via INTRAVENOUS

## 2019-05-08 MED ORDER — HYOSCYAMINE SULFATE 0.125 MG PO TBDI
.125 mg | ORAL_TABLET | SUBLINGUAL | 1 refills | Status: AC | PRN
Start: 2019-05-08 — End: ?
  Filled 2019-05-09: qty 30, 5d supply, fill #1

## 2019-05-08 MED ORDER — FERROUS GLUCONATE 324 MG (37.5 MG IRON) PO TAB
324 mg | Freq: Every day | ORAL | 0 refills | Status: DC
Start: 2019-05-08 — End: 2019-05-09
  Administered 2019-05-08 – 2019-05-09 (×2): 324 mg via ORAL

## 2019-05-08 MED ORDER — LIDOCAINE HCL 2 % MM JELP
TOPICAL | 0 refills | Status: DC | PRN
Start: 2019-05-08 — End: 2019-05-09
  Administered 2019-05-09: 04:00:00 6.000 mL via TOPICAL

## 2019-05-08 MED ORDER — HYDROXYZINE HCL 25 MG PO TAB
50 mg | Freq: Three times a day (TID) | ORAL | 0 refills | Status: DC | PRN
Start: 2019-05-08 — End: 2019-05-09

## 2019-05-08 MED ORDER — OXYCODONE 5 MG PO TAB
5-10 mg | ORAL_TABLET | ORAL | 0 refills | 6.00000 days | Status: DC | PRN
Start: 2019-05-08 — End: 2019-05-09
  Filled 2019-05-08: qty 40, 4d supply, fill #1

## 2019-05-08 MED ORDER — LORAZEPAM 0.5 MG PO TAB
1 | ORAL_TABLET | ORAL | 0 refills | 12.00000 days | Status: AC | PRN
Start: 2019-05-08 — End: ?
  Filled 2019-05-09: qty 30, 8d supply, fill #1

## 2019-05-08 MED ORDER — LORAZEPAM 1 MG PO TAB
1 mg | Freq: Once | ORAL | 0 refills | Status: CP
Start: 2019-05-08 — End: ?
  Administered 2019-05-08: 18:00:00 1 mg via ORAL

## 2019-05-09 ENCOUNTER — Encounter: Admit: 2019-05-09 | Discharge: 2019-05-09 | Primary: Adolescent Medicine

## 2019-05-09 ENCOUNTER — Encounter

## 2019-05-09 DIAGNOSIS — N368 Other specified disorders of urethra: Secondary | ICD-10-CM

## 2019-05-09 MED ORDER — TRAZODONE 50 MG PO TAB
25-50 mg | Freq: Once | ORAL | 0 refills | Status: DC
Start: 2019-05-09 — End: 2019-05-09

## 2019-05-09 MED ORDER — VALACYCLOVIR 500 MG PO TAB
2000 mg | Freq: Two times a day (BID) | ORAL | 0 refills | Status: CP
Start: 2019-05-09 — End: ?
  Administered 2019-05-09 (×2): 2000 mg via ORAL

## 2019-05-09 MED ORDER — BACITRACIN ZINC 500 UNIT/GRAM TP OINT
Freq: Every day | TOPICAL | 0 refills | Status: CN
Start: 2019-05-09 — End: ?

## 2019-05-09 MED ORDER — OXYCODONE 10 MG PO TAB
10 mg | ORAL_TABLET | ORAL | 0 refills | 6.00000 days | Status: AC | PRN
Start: 2019-05-09 — End: ?
  Filled 2019-05-09: qty 30, 8d supply, fill #1

## 2019-05-09 MED FILL — PANTOPRAZOLE 40 MG PO TBEC: 40 mg | 30 days supply | Qty: 30 | Fill #2 | Status: CP

## 2019-05-09 MED FILL — BUSPIRONE 5 MG PO TAB: 5 mg | ORAL | 30 days supply | Qty: 60 | Fill #2 | Status: CP

## 2019-05-09 MED FILL — ERGOCALCIFEROL (VITAMIN D2) 1,250 MCG (50,000 UNIT) PO CAP: 1250 mcg (50,000 unit) | ORAL | 28 days supply | Qty: 4 | Fill #2 | Status: CP

## 2019-05-09 MED FILL — MIRTAZAPINE 15 MG PO TAB: 15 mg | ORAL | 30 days supply | Qty: 30 | Fill #2 | Status: CP

## 2019-05-09 MED FILL — MONTELUKAST 10 MG PO TAB: 10 mg | ORAL | 30 days supply | Qty: 30 | Fill #2 | Status: CP

## 2019-05-09 MED FILL — FLUTICASONE PROPIONATE 50 MCG/ACTUATION NA SPSN: 50 mcg/actuation | NASAL | 30 days supply | Qty: 16 | Fill #2 | Status: CP

## 2019-05-09 MED FILL — CETIRIZINE 10 MG PO TAB: 10 mg | ORAL | 30 days supply | Qty: 30 | Fill #2 | Status: CP

## 2019-05-14 ENCOUNTER — Encounter: Admit: 2019-05-14 | Discharge: 2019-05-14 | Primary: Adolescent Medicine

## 2019-05-14 NOTE — Telephone Encounter
Patient lvm asking when he can get his catheter removed.  Called patient to advise he would need to reach out to Urology.  Transferred patient to Urology office.

## 2019-05-15 ENCOUNTER — Encounter: Admit: 2019-05-15 | Discharge: 2019-05-15 | Primary: Adolescent Medicine

## 2019-05-16 ENCOUNTER — Encounter: Admit: 2019-05-16 | Discharge: 2019-05-16 | Primary: Adolescent Medicine

## 2019-05-16 MED FILL — MEPOLIZUMAB 100 MG/ML SC ATIN: 100 mg/mL | SUBCUTANEOUS | 30 days supply | Qty: 3 | Fill #4 | Status: AC

## 2019-05-18 ENCOUNTER — Encounter: Admit: 2019-05-18 | Discharge: 2019-05-18 | Primary: Adolescent Medicine

## 2019-05-18 NOTE — Progress Notes
Attempted to reach patient to complete 3 month mepolizumab (Nucala) reassessment. Left voicemail requesting a call back. Provided patient with pharmacist clinic phone number [phone]). If no return call from patient, will attempt again at a later date.

## 2019-06-08 ENCOUNTER — Encounter: Admit: 2019-06-08 | Discharge: 2019-06-08 | Primary: Adolescent Medicine

## 2019-06-11 ENCOUNTER — Encounter: Admit: 2019-06-11 | Discharge: 2019-06-11 | Primary: Adolescent Medicine

## 2019-06-11 MED FILL — MEPOLIZUMAB 100 MG/ML SC ATIN: 100 mg/mL | SUBCUTANEOUS | 30 days supply | Qty: 3 | Fill #5 | Status: AC

## 2019-06-12 ENCOUNTER — Encounter: Admit: 2019-06-12 | Discharge: 2019-06-12 | Primary: Adolescent Medicine

## 2019-07-03 ENCOUNTER — Encounter: Admit: 2019-07-03 | Discharge: 2019-07-03 | Primary: Adolescent Medicine

## 2019-07-05 ENCOUNTER — Encounter: Admit: 2019-07-05 | Discharge: 2019-07-05 | Primary: Adolescent Medicine

## 2019-07-06 ENCOUNTER — Encounter: Admit: 2019-07-06 | Discharge: 2019-07-06 | Primary: Adolescent Medicine

## 2019-07-06 MED FILL — MEPOLIZUMAB 100 MG/ML SC ATIN: 100 mg/mL | SUBCUTANEOUS | 30 days supply | Qty: 3 | Fill #6 | Status: AC

## 2019-07-23 ENCOUNTER — Encounter: Admit: 2019-07-23 | Discharge: 2019-07-23 | Primary: Adolescent Medicine

## 2019-07-23 MED ORDER — NUCALA 100 MG/ML SC ATIN
300 mg | SUBCUTANEOUS | 5 refills | Status: CN
Start: 2019-07-23 — End: ?

## 2019-07-24 ENCOUNTER — Encounter: Admit: 2019-07-24 | Discharge: 2019-07-24 | Primary: Adolescent Medicine

## 2019-07-25 ENCOUNTER — Encounter: Admit: 2019-07-25 | Discharge: 2019-07-25 | Primary: Adolescent Medicine

## 2019-07-25 MED ORDER — NUCALA 100 MG/ML SC ATIN
300 mg | SUBCUTANEOUS | 5 refills | Status: CN
Start: 2019-07-25 — End: ?

## 2019-07-26 ENCOUNTER — Encounter: Admit: 2019-07-26 | Discharge: 2019-07-26 | Primary: Adolescent Medicine

## 2019-07-27 ENCOUNTER — Encounter: Admit: 2019-07-27 | Discharge: 2019-07-27 | Primary: Adolescent Medicine

## 2019-07-28 MED ORDER — NUCALA 100 MG/ML SC ATIN
300 mg | SUBCUTANEOUS | 3 refills | 28.00000 days | Status: AC
Start: 2019-07-28 — End: ?
  Filled 2019-09-03: qty 3, 30d supply, fill #1

## 2019-07-30 ENCOUNTER — Encounter: Admit: 2019-07-30 | Discharge: 2019-07-30 | Primary: Adolescent Medicine

## 2019-08-01 ENCOUNTER — Encounter: Admit: 2019-08-01 | Discharge: 2019-08-01 | Primary: Adolescent Medicine

## 2019-09-03 ENCOUNTER — Encounter: Admit: 2019-09-03 | Discharge: 2019-09-03 | Primary: Adolescent Medicine

## 2021-06-03 ENCOUNTER — Encounter: Admit: 2021-06-03 | Discharge: 2021-06-03 | Primary: Adolescent Medicine

## 2021-08-19 ENCOUNTER — Encounter: Admit: 2021-08-19 | Discharge: 2021-08-19 | Payer: MEDICAID | Primary: Adolescent Medicine

## 2021-08-19 ENCOUNTER — Emergency Department: Admit: 2021-08-19 | Discharge: 2021-08-19 | Payer: MEDICAID

## 2021-08-19 ENCOUNTER — Inpatient Hospital Stay: Admit: 2021-08-19 | Payer: MEDICAID

## 2021-08-19 ENCOUNTER — Inpatient Hospital Stay: Admit: 2021-08-19 | Discharge: 2021-08-19 | Payer: MEDICAID | Primary: Adolescent Medicine

## 2021-08-19 DIAGNOSIS — R040 Epistaxis: Secondary | ICD-10-CM

## 2021-08-19 DIAGNOSIS — M3130 Wegener's granulomatosis without renal involvement: Secondary | ICD-10-CM

## 2021-08-19 DIAGNOSIS — R319 Hematuria, unspecified: Secondary | ICD-10-CM

## 2021-08-19 DIAGNOSIS — M301 Polyarteritis with lung involvement [Churg-Strauss]: Secondary | ICD-10-CM

## 2021-08-19 LAB — IRON + BINDING CAPACITY + %SAT+ FERRITIN
FERRITIN: 114 ng/mL (ref 30–300)
IRON BINDING: 356 ug/dL (ref 270–380)
IRON: 64 ug/dL (ref 50–185)

## 2021-08-19 LAB — C REACTIVE PROTEIN (CRP): C-REACTIVE PROTEIN: 0 mg/dL — ABNORMAL LOW (ref <1.0)

## 2021-08-19 LAB — CBC AND DIFF
ABSOLUTE BASO COUNT: 0 K/UL (ref 0–0.20)
ABSOLUTE EOS COUNT: 0 K/UL (ref 0–0.45)
ABSOLUTE MONO COUNT: 0.6 K/UL (ref 0–0.80)
ABSOLUTE NEUTROPHIL: 4.3 K/UL (ref 1.8–7.0)
MDW (MONOCYTE DISTRIBUTION WIDTH): 16 (ref <20.7)
MPV: 7.9 FL (ref 7–11)
PLATELET COUNT: 280 K/UL (ref 150–400)
RBC COUNT: 4.4 M/UL (ref 4.4–5.5)
WBC COUNT: 8.3 K/UL (ref 4.5–11.0)

## 2021-08-19 LAB — COMPREHENSIVE METABOLIC PANEL
CALCIUM: 9.2 mg/dL (ref 8.5–10.6)
EGFR: >60 mL/min (ref >60)
GLUCOSE,PANEL: 83 mg/dL (ref 70–100)
POTASSIUM: 4 MMOL/L — ABNORMAL LOW (ref 3.5–5.1)

## 2021-08-19 LAB — MAGNESIUM: MAGNESIUM: 2.1 mg/dL — AB (ref 1.6–2.6)

## 2021-08-19 LAB — PROTIME INR (PT): PROTIME: 10 s (ref 9.5–14.2)

## 2021-08-19 LAB — URINALYSIS MICROSCOPIC REFLEX TO CULTURE

## 2021-08-19 LAB — SED RATE: ESR: 12 mm/h (ref 0–15)

## 2021-08-19 LAB — URINALYSIS DIPSTICK REFLEX TO CULTURE
LEUKOCYTES: NEGATIVE MMOL/L (ref 21–30)
NITRITE: NEGATIVE U/L (ref 7–40)
URINE ASCORBIC ACID, UA: NEGATIVE % (ref 0–2)

## 2021-08-19 LAB — HIGH SENSITIVITY TROPONIN I 0 HOUR: HIGH SENSITIVITY TROPONIN I 0 HOUR: 3 ng/L (ref <20)

## 2021-08-19 LAB — POC LACTATE: LACTIC ACID POC: 0.4 MMOL/L — ABNORMAL LOW (ref 0.5–2.0)

## 2021-08-19 LAB — POC CREATININE, RAD: CREATININE, POC: 0.9 mg/dL (ref 0.4–1.24)

## 2021-08-19 LAB — PTT (APTT): PTT: 39 s — ABNORMAL HIGH (ref 24.0–36.5)

## 2021-08-19 MED ORDER — MIRTAZAPINE 15 MG PO TAB
15 mg | Freq: Every evening | ORAL | 0 refills | Status: CN
Start: 2021-08-19 — End: ?

## 2021-08-19 MED ORDER — OXYCODONE 5 MG PO TAB
5-10 mg | ORAL | 0 refills | Status: AC | PRN
Start: 2021-08-19 — End: ?
  Administered 2021-08-19 – 2021-08-20 (×4): 10 mg via ORAL

## 2021-08-19 MED ORDER — POLYETHYLENE GLYCOL 3350 17 GRAM PO PWPK
1 | Freq: Every day | ORAL | 0 refills | Status: AC | PRN
Start: 2021-08-19 — End: ?

## 2021-08-19 MED ORDER — ONDANSETRON HCL (PF) 4 MG/2 ML IJ SOLN
4 mg | INTRAVENOUS | 0 refills | Status: AC | PRN
Start: 2021-08-19 — End: ?

## 2021-08-19 MED ORDER — HYOSCYAMINE SULFATE 0.125 MG PO TBDI
.125 mg | SUBLINGUAL | 0 refills | Status: CN | PRN
Start: 2021-08-19 — End: ?

## 2021-08-19 MED ORDER — BUSPIRONE 5 MG PO TAB
5 mg | Freq: Two times a day (BID) | ORAL | 0 refills | Status: CN
Start: 2021-08-19 — End: ?

## 2021-08-19 MED ORDER — SENNOSIDES-DOCUSATE SODIUM 8.6-50 MG PO TAB
1 | Freq: Every day | ORAL | 0 refills | Status: AC | PRN
Start: 2021-08-19 — End: ?

## 2021-08-19 MED ORDER — MONTELUKAST 10 MG PO TAB
10 mg | Freq: Every evening | ORAL | 0 refills | Status: CN
Start: 2021-08-19 — End: ?

## 2021-08-19 MED ORDER — CETIRIZINE 10 MG PO TAB
10 mg | Freq: Every morning | ORAL | 0 refills | Status: AC
Start: 2021-08-19 — End: ?
  Administered 2021-08-19 – 2021-08-21 (×3): 10 mg via ORAL

## 2021-08-19 MED ORDER — HYDROMORPHONE (PF) 2 MG/ML IJ SYRG
.5-1 mg | INTRAVENOUS | 0 refills | Status: AC | PRN
Start: 2021-08-19 — End: ?
  Administered 2021-08-19 – 2021-08-21 (×7): 1 mg via INTRAVENOUS

## 2021-08-19 MED ORDER — FENTANYL CITRATE (PF) 50 MCG/ML IJ SOLN
75 ug | Freq: Once | INTRAVENOUS | 0 refills | Status: CP
Start: 2021-08-19 — End: ?
  Administered 2021-08-19: 09:00:00 75 ug via INTRAVENOUS

## 2021-08-19 MED ORDER — ALBUTEROL SULFATE 90 MCG/ACTUATION IN HFAA
2 | RESPIRATORY_TRACT | 0 refills | Status: AC | PRN
Start: 2021-08-19 — End: ?
  Administered 2021-08-19: 14:00:00 2 via RESPIRATORY_TRACT

## 2021-08-19 MED ORDER — PANTOPRAZOLE 40 MG PO TBEC
40 mg | Freq: Every day | ORAL | 0 refills | Status: AC
Start: 2021-08-19 — End: ?
  Administered 2021-08-20 – 2021-08-21 (×2): 40 mg via ORAL

## 2021-08-19 MED ORDER — OXYBUTYNIN CHLORIDE 5 MG PO TAB
5 mg | Freq: Three times a day (TID) | ORAL | 0 refills | Status: CN | PRN
Start: 2021-08-19 — End: ?

## 2021-08-19 MED ORDER — BUDESONIDE-FORMOTEROL 160-4.5 MCG/ACTUATION IN HFAA
2 | Freq: Two times a day (BID) | RESPIRATORY_TRACT | 0 refills | Status: AC
Start: 2021-08-19 — End: ?
  Administered 2021-08-19: 14:00:00 2 via RESPIRATORY_TRACT

## 2021-08-19 MED ORDER — PERFLUTREN LIPID MICROSPHERES 1.1 MG/ML IV SUSP
1-10 mL | Freq: Once | INTRAVENOUS | 0 refills | Status: CP | PRN
Start: 2021-08-19 — End: ?
  Administered 2021-08-19: 19:00:00 1.5 mL via INTRAVENOUS

## 2021-08-19 MED ORDER — LACTATED RINGERS IV SOLP
500 mL | Freq: Once | INTRAVENOUS | 0 refills | Status: CP
Start: 2021-08-19 — End: ?
  Administered 2021-08-19: 10:00:00 500 mL via INTRAVENOUS

## 2021-08-19 MED ORDER — ONDANSETRON 4 MG PO TBDI
4 mg | ORAL | 0 refills | Status: AC | PRN
Start: 2021-08-19 — End: ?

## 2021-08-19 MED ORDER — DICYCLOMINE 10 MG PO CAP
10 mg | Freq: Before meals | ORAL | 0 refills | Status: CN
Start: 2021-08-19 — End: ?

## 2021-08-19 MED ORDER — FENTANYL CITRATE (PF) 50 MCG/ML IJ SOLN
75 ug | INTRAVENOUS | 0 refills | Status: CP | PRN
Start: 2021-08-19 — End: ?
  Administered 2021-08-19 (×2): 75 ug via INTRAVENOUS

## 2021-08-19 MED ORDER — MELATONIN 5 MG PO TAB
5 mg | Freq: Every evening | ORAL | 0 refills | Status: AC | PRN
Start: 2021-08-19 — End: ?

## 2021-08-19 MED ORDER — LORAZEPAM 1 MG PO TAB
.5 mg | ORAL | 0 refills | Status: AC | PRN
Start: 2021-08-19 — End: ?
  Administered 2021-08-19 – 2021-08-21 (×7): 0.5 mg via ORAL

## 2021-08-19 MED ORDER — LORAZEPAM 2 MG/ML IJ SOLN
.5 mg | Freq: Once | INTRAVENOUS | 0 refills | Status: CP
Start: 2021-08-19 — End: ?
  Administered 2021-08-19: 09:00:00 0.5 mg via INTRAVENOUS

## 2021-08-19 MED ORDER — ACETAMINOPHEN 325 MG PO TAB
650 mg | ORAL | 0 refills | Status: DC | PRN
Start: 2021-08-19 — End: 2021-08-19

## 2021-08-19 NOTE — Consults
Rheumatology Consult Note    Jonathon Simon  Date of Admission: 08/19/2021    Reason for consult:   Hematuria with history of EGPA    Impression:  It is less likely that patient's hematuria is due to EGPA.  Typically, EGPA would cause microscopic hematuria rather than gross hematuria.  He was evaluated in the past for a similar condition and he was not found to have urethral lesion; when evaluated with cystoscopy.    Patient was evaluated by the urology team today.  No acute intervention is needed.  If he continues to bleed, Foley catheter is recommended.  Patient is to follow-up as an outpatient.    Patient's hematuria could be due to his bleeding diathesis with abnormal flow) factor.    Patient's blood in stools could be due to eosinophilic infiltration of the stomach.  Would recommend GI evaluation.    Patient's chest pain is less likely to be due to cardiac involvement.  Troponin is within normal.  Echocardiography done 08/19/2021: Within normal with slightly reduced EF 55% (compared to 60% from the echo in 2020, which also showed LV dilatation, no ischemia/inflammation/inflammatory process).     Noted that he underwent CT of the head that came back normal.  CT of the sinuses showed sinonasal mucopurulent fracture renal improvement after showed interval bronchial wall thickening (asthma versus bronchitis:  CT of the abdomen and pelvis was within normal.    Assessment:  1. Recurrent gross hematuria  2. EGPA (nasal polyposis, chronic sinusitis, asthma, GI infiltration with eosinophils)  3. History of elevated IgE  4. Eosinophilic gastroenteritis  5. 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 Obtain MPO/PR-3 with reflex to ANCA, ESR/CRP, urine studies (microscopy, dipstick, urine protein creatinine ratio), and IgE level, iron panel  2. Agree with obtaining echocardiography  3. Please consult hematology to further comment whether hematuria could be related to his bleeding disorder with fulfillment factor deficiency  4. Please consult with the GI team for further evaluation of the GI symptoms  5. Patient is supposed to have his next Nucala injection on 08/24/2021.  He is having issues with his [place] Medicaid.  6. No need for prednisone/immunosuppression at this point  7. We will continue to monitor        Thank you for the consult. Please page [phone] with any questions.   Patient seen and plan discussed with Dr. Sharene Skeans, MD  Rheumatology Fellow PGY-5  08/19/2021      HPI:      Jonathon Simonis a 26 y.o.?male?with a PMHx of Jonathon Simon?is a 26 y.o.?male?with a PMHx of EGPA with GI involvement, nasal polyps, chronic sinusitis, intrinsic asthma, chronic abdominal pain and history of recurrent hematuria presented to emergency department for further evaluation of gross hematuria.    Patient reports this has been recurrent for the past 2 years.  It has improved for a while before it came back with thrombosis, gushing of blood that would saturate his underwear.    He has been evaluated by urology at [place] back in 2020 and cystoscopy was done, no ulcer was found.  He was also seen by the urologist in the community, reportedly there was a urethral biopsy done and this showed  skin tag?    He reports abdominal pain with nausea and vomiting, and blood per rectum.    He also has chest pain.    He denies trouble breathing, worsening of his asthma or chronic sinusitis.  Review of system is negative for rash, fever, chills, tinnitus, hand/foot drop, frothy urine.    He has been on Nucala for years.  Initially at 100 mg every month.  Then, he was maintained on 300 mg every month.  Currently, he follows with Dr. Vevelyn Royals (rheumatologist).  His last dose of Nucala was in 07/25/2021 (300 mg).  He has trouble with his insurance providing him with no:Marland Kitchen  His upcoming dose would be on 08/24/2021.  His last prednisone was given 3 months ago.       ROS: Comprehensive 14 point ROS negative except as stated in HPI      PMH:     Medical History:   Diagnosis Date   ? Anxiety    ? Asthma    ? GERD (gastroesophageal reflux disease)    ? Nasal polyps    ? No blood products     Pt is Jehov[employer]'s Witness, does not want any blood products.    ? Rhinitis    ? Sinusitis        PSH:     Surgical History:   Procedure Laterality Date   ? NASAL ENDOSCOPY WITH TOTAL ETHMOIDECTOMY AND SPHENOIDOTOMY WITH TISSUE REMOVAL Bilateral 10/29/2016    Performed by Lamar Benes, MD at [facility]   ? NASAL ENDOSCOPY WITH MAXILLARY ANTROSTOMY WITH TISSUE MAXILLARY REMOVAL Bilateral 10/29/2016    Performed by Lamar Benes, MD at [facility]   ? NASAL ENDOSCOPY WITH FRONTAL SINUSOTOMY Bilateral 10/29/2016    Performed by Lamar Benes, MD at [facility]   ? FUNCTIONAL ENDOSCOPY SINUS SURGERY IMAGE-GUIDED Bilateral 10/29/2016    Performed by Lamar Benes, MD at [facility]   ? SUBMUCOUS RESECTION TURBINATE Bilateral 10/29/2016    Performed by Lamar Benes, MD at [facility]   ? Colonoscopy N/A 01/29/2019    Performed by Onnie Boer, MD at [facility]   ? SMALL INTESTINAL ENTEROSCOPY BEYOND SECOND PORTION DUODENUM WITH BIOPSY N/A 01/29/2019    Performed by Onnie Boer, MD at [facility] END[facility]   ? COLONOSCOPY WITH BIOPSY - FLEXIBLE N/A 01/29/2019    Performed by Onnie Boer, MD at [facility] END[facility]   ? BIOPSY      biopsy on pt's penis       SOCIAL HISTORY:     Social History     Socioeconomic History   ? Marital status: Single   ? Number of children: 0   Tobacco Use   ? Smoking status: Never   ? Smokeless tobacco: Never   Vaping Use   ? Vaping Use: Never used   Substance and Sexual Activity   ? Alcohol use: Yes     Comment: pt states I would drink on occasion in the past.   ? Drug use: Never       FAMILY HISTORY:   No family history on file.    MEDS / ALLERGIES          MEDSbudesonide-formoterol HFA, 2 puff, Inhalation, BID  cetirizine, 10 mg, Oral, QAM8  pantoprazole DR, 40 mg, Oral, QDAY(21)     Prn albuterol sulfate Q6H PRN 2 puff at 08/19/21 0914, HYDROmorphone (DILAUDID) injection Q6H PRN 1 mg at 08/19/21 1837, LORazepam Q6H PRN 0.5 mg at 08/19/21 1633, melatonin QHS PRN, ondansetron Q6H PRN **OR** ondansetron (ZOFRAN) IV Q6H PRN, oxyCODONE Q4H PRN 10 mg at 08/19/21 1633, polyethylene glycol 3350 QDAY PRN, senna/docusate QDAY PRN     HOME MEDS  No current facility-administered medications on file prior to encounter.     Current Outpatient Medications on File Prior to Encounter   Medication Sig Dispense Refill   ? albuterol sulfate (PROAIR HFA) 90 mcg/actuation HFA aerosol inhaler Inhale two puffs by mouth into the lungs every 4 hours as needed for Wheezing or Shortness of Breath. Shake well before use. 8.5 g 1   ? bacitracin 500 unit/g topical ointment Apply  topically to affected area daily. Apply to tip of penis 14 g 0   ? budesonide-formoterol (SYMBICORT HFA) 160-4.5 mcg/actuation inhalation Inhale two puffs by mouth into the lungs twice daily. 10.2 g 1   ? busPIRone (BUSPAR) 5 mg tablet Take one tablet by mouth twice daily. 60 tablet 1   ? calcium carbonate (TUMS) 500 mg (200 mg elemental calcium) chewable tablet Chew one tablet by mouth every 4 hours as needed.     ? cetirizine (ZYRTEC) 10 mg tablet Take one tablet by mouth every morning. 30 tablet 1   ? dicyclomine (BENTYL) 10 mg capsule Take one capsule by mouth before meals and at bedtime. 120 capsule 1   ? EPINEPHrine (EPIPEN) 1 mg/mL injection pen (2-Pack) Inject 0.3 mg (1 Pen) into thigh if needed for anaphylactic reaction. May repeat in 5-15 minutes if needed. 2 each 1   ? ergocalciferol (VITAMIN D-2) 1,250 mcg (50,000 unit) capsule Take one capsule by mouth every 7 days. 4 capsule 3   ? ferrous gluconate 324 mg (37.5 mg iron) tab Take one tablet by mouth daily with breakfast. 90 tablet 0   ? fluticasone propionate (FLONASE) 50 mcg/actuation nasal spray, suspension Apply two sprays to each nostril as directed daily. Shake bottle gently before using. (Patient taking differently: Apply 2 sprays to each nostril as directed twice daily. Shake bottle gently before using.) 16 g 1   ? hyoscyamine (ANASPAZ) 0.125 mg rapid dissolve tablet Place one tablet under tongue every 4 hours as needed. 30 tablet 1   ? LORazepam (ATIVAN) 0.5 mg tablet Take one tablet by mouth every 6 hours as needed for Nausea. 30 tablet 0   ? mepolizumab (NUCALA) 100 mg/mL syringe Inject 300 mg under the skin every 30 days. 3 mL 3   ? mirtazapine (REMERON) 15 mg tablet Take one tablet by mouth at bedtime daily. 30 tablet 1   ? montelukast (SINGULAIR) 10 mg tablet Take one tablet by mouth at bedtime daily. 30 tablet 1   ? MULTIVITAMIN PO Take 1 capsule by mouth daily.     ? ondansetron (ZOFRAN) 4 mg tablet Take 4 mg by mouth every 8 hours as needed for Nausea or Vomiting.     ? oxybutynin chloride (DITROPAN) 5 mg tablet Take one tablet by mouth three times daily as needed. 270 tablet 0   ? oxyCODONE (ROXICODONE) 10 mg tablet Take one tablet by mouth every 6 hours as needed for Pain 30 tablet 0   ? pantoprazole DR (PROTONIX) 40 mg tablet Take 1 tablet by mouth twice daily until 10/30, then reduce to once daily and continue indefinitely. 60 tablet 1   ? PEG 400-Hypromellose-Glycerin (VISINE DRY EYE RELIEF) 1-0.2-0.2 % drop Place  into or around eye(s) daily.     ? polyethylene glycol 3350 (MIRALAX) 17 g packet Take one packet by mouth daily. 12 each 1   ? senna/docusate (SENOKOT-S) 8.6/50 mg tablet Take two tablets by mouth twice daily. 60 tablet 1   ? spacer inhalation device (AEROCHAMBER) spcr use as directed with  inhaler 1 each 0   ? triamcinolone acetonide (KENALOG) 0.1 % topical cream Apply  topically to affected area twice daily. 80 g 1        ALLERGIES:  Allergies   Allergen Reactions   ? Iodine ANAPHYLAXIS   ? Tylenol [Acetaminophen] ANAPHYLAXIS   ? Peanut ANAPHYLAXIS       PHYSICAL EXAMINATION:     Vital Signs: Last Filed In 24 Hours Vital Signs: 24 Hour Range   BP: 118/63 (05/03 1651)  Temp: 36.5 ?C (97.7 ?F) (05/03 1651)  Pulse: 68 (05/03 1725)  Respirations: 16 PER MINUTE (05/03 1725)  SpO2: 98 % (05/03 1725)  O2 Device: None (Room air) (05/03 1725)  SpO2 Pulse: 64 (05/03 1600)  Height: 182.9 cm (6') (05/03 1651) BP: (107-139)/(53-89)   Temp:  [36.5 ?C (97.7 ?F)]   Pulse:  [58-80]   Respirations:  [10 PER MINUTE-22 PER MINUTE]   SpO2:  [96 %-100 %]   O2 Device: None (Room air)   GEN: Alert and oriented x3, in no apparent distress  HEENT:  no scleral icterus/pallor,PERRL mucous membranes moist. No oral ulcers seen.    NECK: No JVD, neck supple, no thyromegaly, no cervical or supraclavicular adenopathy  RESP: Clear to auscultation bilaterally, no wheezing/rales/ronchi  CVS: Regular rate and rhythm, S1+S2+, no murmurs/rubs/gallops appreciated  ABD: Soft, non tender, non distended, no palpable masses or hepatosplenomegaly, BS+  NEURO: CN II-XII grossly intact bilaterally, motor strength 5/5.  No hand/foot drop  PSYCH: Speech and thought content normal. Mood and affect appropriate.  SKIN: No rashes  EXT: No edema/cyanosis/clubbing.  MSK: No swelling or tenderness of joints of BUE and BLE. Normal ROM in bil upper extremities and bilateral lower extremities.    LABS:     Recent Labs     08/19/21  0327 08/19/21  0328   NA  --  142   K  --  4.0   CL  --  106   CO2  --  27   GAP  --  9   BUN  --  19   CR 0.9 0.98   GLU  --  83   CA  --  9.2   ALBUMIN  --  4.3   MG  --  2.1       Recent Labs     08/19/21  0328 08/19/21  0356   WBC 8.3  --    HGB 12.4*  --    HCT 37.8*  --    PLTCT 280  --    PT  --  10.7   INR  --  1.0   PTT  --  39.2*   AST 19  --    ALT 16  --    ALKPHOS 49  --      Endocrine:   Lab Results   Component Value Date    TSH 4.48 08/19/2021          EKG/RADIOLOGY  Pertinent results reviewed.

## 2021-08-19 NOTE — Care Coordination-Inpatient
[phone] will take calls for this patient till 8 AM.  Afterwards, kindly contact [facility] for questions and concerns ( Voalte is the preferred way of communication).

## 2021-08-19 NOTE — Progress Notes
RT Adult Assessment Note    NAME:Jonathon Simon             MRN: [id number]             DOB:1995/06/03          AGE: 26 y.o.  ADMISSION DATE: 08/19/2021             DAYS ADMITTED: LOS: 0 days    RT Treatment Plan:  Protocol Plan: Medications  Albuterol: MDI PRN  Symbicort (Home regimen only): BID    Protocol Plan: Procedures  PEP Therapy: Place a nursing order for "IS Q1h While Awake" for any of Lung Expansion indicators    Additional Comments:  Impressions of the patient: Patient on RA, NAD. Bilateral wheezes appreciated throughout.  Intervention(s)/outcome(s): RT Assessment; inhalers  Patient education that was completed: NA  Recommendations to the care team: Home reg meds.    Vital Signs:  Pulse: 65  RR: 14 PER MINUTE  SpO2: 100 %  O2 Device: None (Room air)  Liter Flow:    O2%:    Breath Sounds: Inspiratory wheezes;Expiratory wheezes  Respiratory Effort:  Non-Labored

## 2021-08-19 NOTE — Progress Notes
Patient arrived on unit via wheelchair accompanied by transport. Patient transferred to the bed without assistance. Assessment completed, refer to flowsheet for details. Orders released, reviewed, and implemented as appropriate. Oriented to surroundings, call light within reach. Plan of care reviewed.  Will continue to monitor and assess.

## 2021-08-19 NOTE — Unmapped
Blood Preference Conversation    Individuals present for blood preference conversation: attending physician and patient    Pertinent details of conversation (including direct quote from patient or surrogate): Patient states she does not want any kind of blood transfusion regardless of complication including possible death.    Outcome of conversation, if this were a life/death situation would you accept blood?  Under no circumstances; patient states would never want any blood or blood products, even in life or death situation    [facility] Procedures - Quick Lists for: Blood and blood product transfusion ([email or web address])

## 2021-08-20 ENCOUNTER — Encounter: Admit: 2021-08-20 | Discharge: 2021-08-20 | Payer: MEDICAID | Primary: Adolescent Medicine

## 2021-08-20 DIAGNOSIS — F419 Anxiety disorder, unspecified: Secondary | ICD-10-CM

## 2021-08-20 DIAGNOSIS — K219 Gastro-esophageal reflux disease without esophagitis: Secondary | ICD-10-CM

## 2021-08-20 DIAGNOSIS — K589 Irritable bowel syndrome without diarrhea: Secondary | ICD-10-CM

## 2021-08-20 DIAGNOSIS — J339 Nasal polyp, unspecified: Secondary | ICD-10-CM

## 2021-08-20 DIAGNOSIS — R220 Localized swelling, mass and lump, head: Secondary | ICD-10-CM

## 2021-08-20 DIAGNOSIS — Z789 Other specified health status: Secondary | ICD-10-CM

## 2021-08-20 DIAGNOSIS — I429 Cardiomyopathy, unspecified: Secondary | ICD-10-CM

## 2021-08-20 DIAGNOSIS — J329 Chronic sinusitis, unspecified: Secondary | ICD-10-CM

## 2021-08-20 DIAGNOSIS — J310 Chronic rhinitis: Secondary | ICD-10-CM

## 2021-08-20 DIAGNOSIS — J45909 Unspecified asthma, uncomplicated: Secondary | ICD-10-CM

## 2021-08-20 MED ADMIN — SODIUM CHLORIDE 0.9 % IV SOLP [27838]: 250 mL | INTRAVENOUS | @ 16:00:00 | NDC 00338004902

## 2021-08-20 MED ADMIN — ARTIFICIAL TEARS (PF) SINGLE DOSE DROPS GROUP [280009]: 1 [drp] | OPHTHALMIC | @ 16:00:00 | NDC 00065806301

## 2021-08-20 MED ADMIN — OXYCODONE 15 MG PO TAB [28899]: 15 mg | ORAL | @ 20:00:00 | NDC 68084097511

## 2021-08-21 ENCOUNTER — Encounter: Admit: 2021-08-21 | Discharge: 2021-08-21 | Payer: MEDICAID | Primary: Adolescent Medicine

## 2021-08-21 ENCOUNTER — Observation Stay: Admit: 2021-08-21 | Discharge: 2021-08-21 | Payer: MEDICAID | Primary: Adolescent Medicine

## 2021-08-21 MED ADMIN — OXYCODONE 5 MG PO TAB [10814]: 15 mg | ORAL | @ 16:00:00 | Stop: 2021-08-21 | NDC 00904696661

## 2021-08-21 MED ADMIN — OXYCODONE 5 MG PO TAB [10814]: 15 mg | ORAL | @ 03:00:00 | NDC 00904696661

## 2021-08-21 MED ADMIN — HYDROMORPHONE (PF) 2 MG/ML IJ SYRG [163476]: 1 mg | INTRAVENOUS | @ 18:00:00 | Stop: 2021-08-21 | NDC 00409131203

## 2021-08-21 MED ADMIN — OXYCODONE 5 MG PO TAB [10814]: 15 mg | ORAL | @ 12:00:00 | Stop: 2021-08-21 | NDC 00904696661

## 2021-08-21 MED FILL — LORAZEPAM 0.5 MG PO TAB: 0.5 mg | ORAL | 5 days supply | Qty: 20 | Fill #1 | Status: CP

## 2022-02-26 ENCOUNTER — Encounter: Admit: 2022-02-26 | Discharge: 2022-02-26 | Payer: MEDICAID

## 2022-09-06 ENCOUNTER — Encounter: Admit: 2022-09-06 | Discharge: 2022-09-06 | Payer: MEDICAID

## 2022-09-06 ENCOUNTER — Emergency Department: Admit: 2022-09-06 | Discharge: 2022-09-06 | Payer: MEDICAID

## 2022-09-06 DIAGNOSIS — R519 Acute intractable headache, unspecified headache type: Secondary | ICD-10-CM

## 2022-09-06 DIAGNOSIS — R079 Chest pain, unspecified: Secondary | ICD-10-CM

## 2022-09-06 LAB — URINALYSIS DIPSTICK REFLEX TO CULTURE
LEUKOCYTES: NEGATIVE
NITRITE: NEGATIVE
URINE ASCORBIC ACID, UA: NEGATIVE
URINE BILE: NEGATIVE
URINE BLOOD: NEGATIVE
URINE PH: 5 (ref 5.0–8.0)
URINE SPEC GRAVITY: 1 /HPF — ABNORMAL HIGH (ref 1.005–1.030)

## 2022-09-06 LAB — COMPREHENSIVE METABOLIC PANEL
ALBUMIN: 4.4 g/dL (ref 3.5–5.0)
ALT: 23 U/L (ref 7–56)
AST: 20 U/L (ref 7–40)
BLD UREA NITROGEN: 19 mg/dL (ref 7–25)
CALCIUM: 9.1 mg/dL (ref 8.5–10.6)
CHLORIDE: 107 MMOL/L — ABNORMAL LOW (ref 98–110)
CREATININE: 1 mg/dL (ref 0.4–1.24)
GLUCOSE,PANEL: 95 mg/dL (ref 70–100)
SODIUM: 140 MMOL/L (ref 137–147)
TOTAL BILIRUBIN: 0.5 mg/dL (ref 0.2–1.3)
TOTAL PROTEIN: 7.4 g/dL (ref 6.0–8.0)

## 2022-09-06 LAB — CBC AND DIFF
ABSOLUTE BASO COUNT: 0.1 K/UL (ref 0–0.20)
ABSOLUTE EOS COUNT: 0 K/UL (ref 0–0.45)
ABSOLUTE LYMPH COUNT: 3.3 K/UL (ref >60)
ABSOLUTE MONO COUNT: 0.9 K/UL — ABNORMAL HIGH (ref 0–0.80)
ABSOLUTE NEUTROPHIL: 6.9 K/UL (ref 1.8–7.0)
EOSINOPHILS %: 1 % (ref 0–5)
LYMPHOCYTES %: 30 % (ref 24–44)
MDW (MONOCYTE DISTRIBUTION WIDTH): 15 (ref <20.7)
WBC COUNT: 11 K/UL — ABNORMAL HIGH (ref 4.5–11.0)

## 2022-09-06 LAB — URINALYSIS MICROSCOPIC REFLEX TO CULTURE

## 2022-09-06 LAB — CHLAM/NG PCR URINE
CHLAMYDIA TRACH PCR: NEGATIVE
NEISSERIA GONORROEAE PCR: NEGATIVE

## 2022-09-06 LAB — HIGH SENSITIVITY TROPONIN I 0 HOUR: HIGH SENSITIVITY TROPONIN I 0 HOUR: 2 ng/L (ref <20)

## 2022-09-06 LAB — MAGNESIUM: MAGNESIUM: 2.1 mg/dL — ABNORMAL LOW (ref 1.6–2.6)

## 2022-09-06 MED ORDER — KETOROLAC 15 MG/ML IJ SOLN
15 mg | Freq: Once | INTRAVENOUS | 0 refills | Status: DC
Start: 2022-09-06 — End: 2022-09-06

## 2022-09-06 NOTE — ED Notes
Pt verbalizes discharge instructions. PIV removed. VSS. NAD. All belongings with pt. No further questions.

## 2022-09-06 NOTE — Unmapped
You were seen at the [facility] Emergency Department for chest pain, headache and urethral pain.  Your labs and imaging showed no concerning findings.  At this time you are appropriate for discharge.  You can take a Profen every 6 hours for pain control.  Eat. Follow up with a primary care provider for this visit.    If you received any narcotic pain medications or sedatives while in the ED you should not drive/operate machinery or perform any high risk activity for 24 hours, or while on those medications. This includes any pain medication other than tylenol, ibuprofen, or toradol. This includes any medication for anxiety.    If your blood pressure is over 130/90, you should see your doctor to get your blood pressure rechecked.     Please report to nearest emergency department for any sudden decline in overall health, new symptoms, or any other concern.

## 2022-09-06 NOTE — ED Provider Notes
Jonathon Simon is a 27 y.o. male.    Chief Complaint:  Chief Complaint   Patient presents with    Headache     Ha, cp, urethral pain, ?STD exposure, ?increased ICP       History of Present Illness:  Jonathon Simon is a 27 year old male with past medical history of eosinophilic granulomatosis, asthma who presents emergency department for headache, chest pain and urethral pain. He has been having headache that feels like pressure in his temples and sinus pain. He also endorses heart chest pain that feels like electricity goes right to left and down his arm.  He has not taken anything for symptoms.  Over the past couple days, he has had some spotting of blood in his underwear as well as a burning pain in his urethra.  He also endorses urinary frequency.  Patient states he has had hematuria in the past related to his eosinophilic granulomatosis.  Patient is concerned for STIs and would like to get tested for that.  He endorses a nonproductive cough along with intermittent shortness of breath.  He denies fever, nausea, vomiting, vision changes or abdominal pain, penile discharge.  Viral illness, pneumonia, costochondritis,      Headache  Associated symptoms: cough    Associated symptoms: no abdominal pain, no back pain, no fever, no nausea, no sore throat and no vomiting        Review of Systems:  Review of Systems   Constitutional:  Negative for fever.   HENT:  Negative for sore throat.    Eyes:  Negative for visual disturbance.   Respiratory:  Positive for cough. Negative for shortness of breath.    Cardiovascular:  Positive for chest pain.   Gastrointestinal:  Negative for abdominal pain, nausea and vomiting.   Genitourinary:  Positive for frequency. Negative for dysuria.   Musculoskeletal:  Negative for back pain.   Skin:  Negative for rash.   Neurological:  Positive for headaches.       Allergies:  Iodine, Peanut, and Tylenol [acetaminophen]    Past Medical History:  Past Medical History:   Diagnosis Date    Anxiety     Asthma     Cardiomyopathy (HCC)     GERD (gastroesophageal reflux disease)     IBS (irritable bowel syndrome)     Mass of head 2013    mucosal mass per patient    Nasal polyps     No blood products     Pt is Jehovah's Witness, does not want any blood products.     Rhinitis     Sinusitis        Past Surgical History:  Surgical History:   Procedure Laterality Date    NASAL ENDOSCOPY WITH TOTAL ETHMOIDECTOMY AND SPHENOIDOTOMY WITH TISSUE REMOVAL Bilateral 10/29/2016    Performed by Lamar Benes, MD at [facility] OR    NASAL ENDOSCOPY WITH MAXILLARY ANTROSTOMY WITH TISSUE MAXILLARY REMOVAL Bilateral 10/29/2016    Performed by Lamar Benes, MD at [facility] OR    NASAL ENDOSCOPY WITH FRONTAL SINUSOTOMY Bilateral 10/29/2016    Performed by Lamar Benes, MD at [facility] OR    FUNCTIONAL ENDOSCOPY SINUS SURGERY IMAGE-GUIDED Bilateral 10/29/2016    Performed by Lamar Benes, MD at [facility] OR    SUBMUCOUS RESECTION TURBINATE Bilateral 10/29/2016    Performed by Lamar Benes, MD at [facility] OR    Colonoscopy N/A 01/29/2019    Performed by Onnie Boer, MD at [facility] ENDO  SMALL INTESTINAL ENTEROSCOPY BEYOND SECOND PORTION DUODENUM WITH BIOPSY N/A 01/29/2019    Performed by Onnie Boer, MD at [facility] ENDO    COLONOSCOPY WITH BIOPSY - FLEXIBLE N/A 01/29/2019    Performed by Onnie Boer, MD at [facility] ENDO    BIOPSY      biopsy on pt's penis       Past Medical History:   Diagnosis Date    Anxiety     Asthma     Cardiomyopathy (HCC)     GERD (gastroesophageal reflux disease)     IBS (irritable bowel syndrome)     Mass of head 2013    mucosal mass per patient    Nasal polyps     No blood products     Pt is Jehovah's Witness, does not want any blood products.     Rhinitis     Sinusitis      Surgical History:   Procedure Laterality Date    NASAL ENDOSCOPY WITH TOTAL ETHMOIDECTOMY AND SPHENOIDOTOMY WITH TISSUE REMOVAL Bilateral 10/29/2016    Performed by Lamar Benes, MD at [facility] OR    NASAL ENDOSCOPY WITH MAXILLARY ANTROSTOMY WITH TISSUE MAXILLARY REMOVAL Bilateral 10/29/2016    Performed by Lamar Benes, MD at [facility] OR    NASAL ENDOSCOPY WITH FRONTAL SINUSOTOMY Bilateral 10/29/2016    Performed by Lamar Benes, MD at [facility] OR    FUNCTIONAL ENDOSCOPY SINUS SURGERY IMAGE-GUIDED Bilateral 10/29/2016    Performed by Lamar Benes, MD at [facility] OR    SUBMUCOUS RESECTION TURBINATE Bilateral 10/29/2016    Performed by Lamar Benes, MD at [facility] OR    Colonoscopy N/A 01/29/2019    Performed by Onnie Boer, MD at [facility] ENDO    SMALL INTESTINAL ENTEROSCOPY BEYOND SECOND PORTION DUODENUM WITH BIOPSY N/A 01/29/2019    Performed by Onnie Boer, MD at [facility] ENDO    COLONOSCOPY WITH BIOPSY - FLEXIBLE N/A 01/29/2019    Performed by Onnie Boer, MD at [facility] ENDO    BIOPSY      biopsy on pt's penis       Social History:  Social History     Tobacco Use    Smoking status: Never    Smokeless tobacco: Never   Vaping Use    Vaping status: Never Used   Substance Use Topics    Alcohol use: Yes     Comment: pt states I would drink on occasion in the past.    Drug use: Never     Social History     Substance and Sexual Activity   Drug Use Never     CBD    Product Type Tincture     Route Oral     Current Frequency Daily     Indication Anxiety     Max Frequency Daily           Family History:  No family history on file.    Vitals:  ED Vitals      Date and Time T BP P RR SPO2P SPO2 User   09/06/22 0732 -- 125/62 67 -- -- 96 % MH   09/06/22 0549 -- -- 74 -- -- 96 % JL   09/06/22 0548 -- 131/80 73 -- -- 96 % JL   09/06/22 0456 -- 132/74 78 18 PER MINUTE -- 100 % AH   09/06/22 0222 36.8 ?C (98.2 ?F) 122/65 79 20 PER MINUTE -- -- HT  Physical Exam:  Physical Exam  Vitals and nursing note reviewed. Exam conducted with a chaperone present.   Constitutional:       Appearance: Normal appearance. He is obese.   HENT:      Head: Normocephalic and atraumatic.      Nose:      Left Sinus: Frontal sinus tenderness present.      Mouth/Throat:      Mouth: Mucous membranes are moist.      Pharynx: No posterior oropharyngeal erythema.   Eyes:      Extraocular Movements: Extraocular movements intact.      Conjunctiva/sclera: Conjunctivae normal.      Pupils: Pupils are equal, round, and reactive to light.   Cardiovascular:      Rate and Rhythm: Normal rate and regular rhythm.      Pulses: Normal pulses.      Heart sounds: Normal heart sounds.   Pulmonary:      Effort: Pulmonary effort is normal. No respiratory distress.      Breath sounds: Normal breath sounds. No wheezing.   Abdominal:      General: There is no distension.      Palpations: Abdomen is soft.      Tenderness: There is no abdominal tenderness.   Genitourinary:     Penis: Normal.       Testes: Normal.   Musculoskeletal:         General: Normal range of motion.      Cervical back: Neck supple.      Right lower leg: No edema.      Left lower leg: No edema.   Skin:     General: Skin is warm and dry.   Neurological:      General: No focal deficit present.      Mental Status: He is alert and oriented to person, place, and time. Mental status is at baseline.   Psychiatric:         Mood and Affect: Mood normal.         Behavior: Behavior normal.         Laboratory Results:  Labs Reviewed   URINALYSIS DIPSTICK REFLEX TO CULTURE - Abnormal       Result Value Ref Range Status    Color,UA YELLOW   Final    Turbidity,UA CLEAR  CLEAR-CLEAR Final    Specific Gravity-Urine 1.031 (*) 1.005 - 1.030 Final    pH,UA 5.0  5.0 - 8.0 Final    Protein,UA 1+ (*) NEG-NEG Final    Glucose,UA NEG  NEG-NEG Final    Ketones,UA 1+ (*) NEG-NEG Final    Bilirubin,UA NEG  NEG-NEG Final    Blood,UA NEG  NEG-NEG Final    Urobilinogen,UA NORMAL  NORM-NORMAL Final    Nitrite,UA NEG  NEG-NEG Final    Leukocytes,UA NEG  NEG-NEG Final    Urine Ascorbic Acid, UA NEG  NEG-NEG Final   CBC AND DIFF - Abnormal    White Blood Cells 11.4 (*) 4.5 - 11.0 K/UL Final    RBC 4.63  4.4 - 5.5 M/UL Final Hemoglobin 12.5 (*) 13.5 - 16.5 GM/DL Final    Hematocrit 16.1 (*) 40 - 50 % Final    MCV 83.8  80 - 100 FL Final    MCH 27.0  26 - 34 PG Final    MCHC 32.3  32.0 - 36.0 G/DL Final    RDW 09.6  11 - 15 % Final    Platelet Count 302  150 - 400 K/UL Final    MPV 7.4  7 - 11 FL Final    Neutrophils 60  41 - 77 % Final    Lymphocytes 30  24 - 44 % Final    Monocytes 8  4 - 12 % Final    Eosinophils 1  0 - 5 % Final    Basophils 1  0 - 2 % Final    Absolute Neutrophil Count 6.93  1.8 - 7.0 K/UL Final    Absolute Lymph Count 3.37  1.0 - 4.8 K/UL Final    Absolute Monocyte Count 0.93 (*) 0 - 0.80 K/UL Final    Absolute Eosinophil Count 0.08  0 - 0.45 K/UL Final    Absolute Basophil Count 0.11  0 - 0.20 K/UL Final    MDW (Monocyte Distribution Width) 15.4  <20.7 Final   URINALYSIS MICROSCOPIC REFLEX TO CULTURE    WBCs,UA 0-2  0 - 2 /HPF Final    RBCs,UA 0-2  0 - 3 /HPF Final    Comment,UA     Final    Value: Criteria for reflex to culture are WBC>10, Positive Nitrite, and/or >=+1   leukocytes. If quantity is not sufficient, an addendum will follow.      UA Reflex Specimen Type and Source     Final    Value: URINE  MIDSTREAM      MucousUA TRACE   Final    Squamous Epithelial Cells 0-2  0 - 5 Final   COMPREHENSIVE METABOLIC PANEL    Sodium 140  045 - 147 MMOL/L Final    Potassium 3.9  3.5 - 5.1 MMOL/L Final    Chloride 107  98 - 110 MMOL/L Final    Glucose 95  70 - 100 MG/DL Final    Blood Urea Nitrogen 19  7 - 25 MG/DL Final    Creatinine 4.09  0.4 - 1.24 MG/DL Final    Calcium 9.1  8.5 - 10.6 MG/DL Final    Total Protein 7.4  6.0 - 8.0 G/DL Final    Total Bilirubin 0.5  0.2 - 1.3 MG/DL Final    Albumin 4.4  3.5 - 5.0 G/DL Final    Alk Phosphatase 47  25 - 110 U/L Final    AST (SGOT) 20  7 - 40 U/L Final    CO2 25  21 - 30 MMOL/L Final    ALT (SGPT) 23  7 - 56 U/L Final    Anion Gap 8  3 - 12 Final    eGFR >60  >60 mL/min Final   MAGNESIUM    Magnesium 2.1  1.6 - 2.6 mg/dL Final   HIGH SENSITIVITY TROPONIN I 0 HOUR    hs Troponin I 0 Hour 2  <20 ng/L Final   UA GREY TOP TUBE          Radiology Interpretation:  CHEST SINGLE VIEW   Final Result         No acute finding.          Finalized by Sherolyn Buba, M.D. on 09/06/2022 6:53 AM. Dictated by Sherolyn Buba, M.D. on 09/06/2022 6:53 AM.             EKG:  ECG Results              [facility] MAIN ECG TRIAGE ONLY (Final result)        Collection Time Result Time VT RATE P-R Interval QRS DURATION Q-T Interval QTC Calc Bazett  09/06/22 02:13:58 09/06/22 06:36:28 77 160 106 370 418             Collection Time Result Time P Axis R Axis T Axis    09/06/22 02:13:58 09/06/22 06:36:28 24 64 1                     Final result                   Impression:    Normal sinus rhythm  T wave inversion in III  No previous ECGs available in M[facility]  Confirmed by Vianne Bulls ([id number]) on 09/06/2022 6:36:25 AM                                    Medical Decision Making:  Oralia Manis is a 27 y.o. male who presents with chief complaint as listed above. Based on the history and presentation, the list of differential diagnoses considered included, but was not limited to, viral illness, costochondritis, pneumonia, STI, UTI.    ED Course  Vitals were appropriate.  Physical exam as above.    CBC and CMP were unremarkable.  Troponin was negative.  Urinalysis showed no concern for infection.  Chest x-ray showed no acute cardiopulmonary findings.    For pain control, patient was offered Toradol.  However, he preferred not to take as it sometimes makes him nauseous.  His symptoms at this time otherwise are well-controlled.    Updated patient on reassuring workup.  Discussed the importance of outpatient follow-up and he was provided with clinic numbers to establish care.  He voiced understand this plan was agreeable to.  Patient was discharged.           Complexity of Problems Addressed  Patient's active diagnoses as well as contributing pre-existing medical problems include:  Clinical Impression   Chest pain, unspecified type   Acute intractable headache, unspecified headache type     Evaluation performed for potential threat to life or bodily function during this visit given the initial differential diagnosis and clinical impression(s) as discussed previously in MDM/ED course.    Additional data reviewed:    History was obtained from an independent historian: Not in addition to what is mentioned above  Prior non-ED notes reviewed: Recent discharge summary and/or H&P and Clinic note  Independent interpretation of diagnostic tests was performed by me: X-Ray: No acute cardiopulmonary findings on x-ray  Patient presentation/management was discussed with the following qualified health care professionals and/or other relevant professionals: Not in addition to what is mentioned above    Risk evaluation:    Diagnosis or treatment of patient condition impacted by social determinant of health: None  Tests Considered but not performed due to clinical scoring (if not mentioned in ED course, aside from what is implied by clinical scores listed): na  Rationale regarding whether admission or escalation of care considered if not performed (if not mentioned in ED course, aside from what is implied by clinical scores listed): na    ED Scoring:                      HEART Total Score: 1  Low (0-3 points), with 0.9-1.7% risk over the next 6 weeks.  Moderate (4-7 points), with 12.0-16.6% risk over the next 6 weeks.  High (8-10 points), with 5--65% risk over the next 6 weeks.  Facility Administered Meds:  Medications - No data to display    Clinical Impression:  Clinical Impression   Chest pain, unspecified type   Acute intractable headache, unspecified headache type       Disposition/Follow up  ED Disposition     ED Disposition   Discharge           [facility]  [address]  [id number] [place] [address]  [phone]          Internal Medicine: [facility]  [address]  [address]  [place] [address]  [phone]          Medications:  Discharge Medication List as of 09/06/2022  8:14 AM          Procedure Notes:  Procedures       Attestation / Supervision:    Ranelle Oyster, DO
